# Patient Record
Sex: Male | Born: 1995 | Race: White | Hispanic: No | Smoking: Current every day smoker
Health system: Southern US, Community
[De-identification: ages and names within clinical notes are randomized; demographics above are authoritative.]

## PROBLEM LIST (undated history)

## (undated) DIAGNOSIS — F319 Bipolar disorder, unspecified: Secondary | ICD-10-CM

## (undated) HISTORY — PX: NOSE SURGERY: SHX723

---
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
## 2013-04-21 ENCOUNTER — Encounter (HOSPITAL_COMMUNITY): Payer: Self-pay | Admitting: Emergency Medicine

## 2013-04-21 DIAGNOSIS — R1110 Vomiting, unspecified: Secondary | ICD-10-CM | POA: Insufficient documentation

## 2013-04-21 DIAGNOSIS — R0602 Shortness of breath: Secondary | ICD-10-CM | POA: Insufficient documentation

## 2013-04-21 DIAGNOSIS — K219 Gastro-esophageal reflux disease without esophagitis: Secondary | ICD-10-CM | POA: Insufficient documentation

## 2013-04-21 DIAGNOSIS — Z792 Long term (current) use of antibiotics: Secondary | ICD-10-CM | POA: Insufficient documentation

## 2013-04-21 DIAGNOSIS — J45901 Unspecified asthma with (acute) exacerbation: Secondary | ICD-10-CM | POA: Insufficient documentation

## 2013-04-21 DIAGNOSIS — Z8659 Personal history of other mental and behavioral disorders: Secondary | ICD-10-CM | POA: Insufficient documentation

## 2013-04-21 DIAGNOSIS — R0789 Other chest pain: Secondary | ICD-10-CM | POA: Insufficient documentation

## 2013-04-21 DIAGNOSIS — F17200 Nicotine dependence, unspecified, uncomplicated: Secondary | ICD-10-CM | POA: Insufficient documentation

## 2013-04-21 NOTE — ED Notes (Signed)
Pt reports states sharp chest pain, coughing, & vomiting that started about 30 min ago.

## 2013-04-22 ENCOUNTER — Emergency Department (HOSPITAL_COMMUNITY): Payer: Medicaid Other

## 2013-04-22 ENCOUNTER — Emergency Department (HOSPITAL_COMMUNITY)
Admission: EM | Admit: 2013-04-22 | Discharge: 2013-04-22 | Disposition: A | Discharge status: Home / Self Care | Payer: Medicaid Other | Attending: Emergency Medicine | Admitting: Emergency Medicine

## 2013-04-22 DIAGNOSIS — R079 Chest pain, unspecified: Secondary | ICD-10-CM

## 2013-04-22 DIAGNOSIS — K219 Gastro-esophageal reflux disease without esophagitis: Secondary | ICD-10-CM

## 2013-04-22 LAB — CBC WITH DIFFERENTIAL/PLATELET
BASOS PCT: 0 % (ref 0–1)
Basophils Absolute: 0 10*3/uL (ref 0.0–0.1)
EOS ABS: 0.2 10*3/uL (ref 0.0–0.7)
Eosinophils Relative: 2 % (ref 0–5)
HEMATOCRIT: 43.4 % (ref 39.0–52.0)
HEMOGLOBIN: 15.6 g/dL (ref 13.0–17.0)
LYMPHS ABS: 2.3 10*3/uL (ref 0.7–4.0)
Lymphocytes Relative: 28 % (ref 12–46)
MCH: 30.2 pg (ref 26.0–34.0)
MCHC: 35.9 g/dL (ref 30.0–36.0)
MCV: 83.9 fL (ref 78.0–100.0)
MONO ABS: 0.7 10*3/uL (ref 0.1–1.0)
MONOS PCT: 9 % (ref 3–12)
NEUTROS ABS: 4.8 10*3/uL (ref 1.7–7.7)
Neutrophils Relative %: 61 % (ref 43–77)
Platelets: 147 10*3/uL — ABNORMAL LOW (ref 150–400)
RBC: 5.17 MIL/uL (ref 4.22–5.81)
RDW: 12.9 % (ref 11.5–15.5)
WBC: 7.9 10*3/uL (ref 4.0–10.5)

## 2013-04-22 LAB — COMPREHENSIVE METABOLIC PANEL
ALBUMIN: 4.1 g/dL (ref 3.5–5.2)
ALK PHOS: 89 U/L (ref 39–117)
ALT: 32 U/L (ref 0–53)
AST: 26 U/L (ref 0–37)
BUN: 18 mg/dL (ref 6–23)
CHLORIDE: 98 meq/L (ref 96–112)
CO2: 24 mEq/L (ref 19–32)
CREATININE: 0.85 mg/dL (ref 0.50–1.35)
Calcium: 9.1 mg/dL (ref 8.4–10.5)
GLUCOSE: 97 mg/dL (ref 70–99)
POTASSIUM: 3.6 meq/L — AB (ref 3.7–5.3)
Sodium: 138 mEq/L (ref 137–147)
Total Protein: 7.5 g/dL (ref 6.0–8.3)

## 2013-04-22 LAB — TROPONIN I

## 2013-04-22 LAB — LIPASE, BLOOD: LIPASE: 25 U/L (ref 11–59)

## 2013-04-22 MED ORDER — GI COCKTAIL ~~LOC~~
30.0000 mL | Freq: Once | ORAL | Status: AC
  Administered 2013-04-22: 30 mL via ORAL
  Filled 2013-04-22: qty 30

## 2013-04-22 MED ORDER — PANTOPRAZOLE SODIUM 40 MG IV SOLR
40.0000 mg | Freq: Once | INTRAVENOUS | Status: AC
  Administered 2013-04-22: 40 mg via INTRAVENOUS
  Filled 2013-04-22: qty 40

## 2013-04-22 MED ORDER — SODIUM CHLORIDE 0.9 % IV SOLN
1000.0000 mL | Freq: Once | INTRAVENOUS | Status: AC
  Administered 2013-04-22: 1000 mL via INTRAVENOUS

## 2013-04-22 MED ORDER — ONDANSETRON HCL 4 MG/2ML IJ SOLN
4.0000 mg | Freq: Once | INTRAMUSCULAR | Status: AC
  Administered 2013-04-22: 4 mg via INTRAVENOUS
  Filled 2013-04-22: qty 2

## 2013-04-22 MED ORDER — SODIUM CHLORIDE 0.9 % IV SOLN: 1000.0000 mL | INTRAVENOUS | Status: DC

## 2013-04-22 NOTE — Discharge Instructions (Signed)
Take Omeprazole (Prilosec OTC) once a day.  Stop using all tobacco products - they can cause heart problems, lung problems, and stomach problems.  Gastroesophageal Reflux Disease, Adult Gastroesophageal reflux disease (GERD) happens when acid from your stomach flows up into the esophagus. When acid comes in contact with the esophagus, the acid causes soreness (inflammation) in the esophagus. Over time, GERD may create small holes (ulcers) in the lining of the esophagus. CAUSES   Increased body weight. This puts pressure on the stomach, making acid rise from the stomach into the esophagus.  Smoking. This increases acid production in the stomach.  Drinking alcohol. This causes decreased pressure in the lower esophageal sphincter (valve or ring of muscle between the esophagus and stomach), allowing acid from the stomach into the esophagus.  Late evening meals and a full stomach. This increases pressure and acid production in the stomach.  A malformed lower esophageal sphincter. Sometimes, no cause is found. SYMPTOMS   Burning pain in the lower part of the mid-chest behind the breastbone and in the mid-stomach area. This may occur twice a week or more often.  Trouble swallowing.  Sore throat.  Dry cough.  Asthma-like symptoms including chest tightness, shortness of breath, or wheezing. DIAGNOSIS  Your caregiver may be able to diagnose GERD based on your symptoms. In some cases, X-rays and other tests may be done to check for complications or to check the condition of your stomach and esophagus. TREATMENT  Your caregiver may recommend over-the-counter or prescription medicines to help decrease acid production. Ask your caregiver before starting or adding any new medicines.  HOME CARE INSTRUCTIONS   Change the factors that you can control. Ask your caregiver for guidance concerning weight loss, quitting smoking, and alcohol consumption.  Avoid foods and drinks that make your symptoms  worse, such as:  Caffeine or alcoholic drinks.  Chocolate.  Peppermint or mint flavorings.  Garlic and onions.  Spicy foods.  Citrus fruits, such as oranges, lemons, or limes.  Tomato-based foods such as sauce, chili, salsa, and pizza.  Fried and fatty foods.  Avoid lying down for the 3 hours prior to your bedtime or prior to taking a nap.  Eat small, frequent meals instead of large meals.  Wear loose-fitting clothing. Do not wear anything tight around your waist that causes pressure on your stomach.  Raise the head of your bed 6 to 8 inches with wood blocks to help you sleep. Extra pillows will not help.  Only take over-the-counter or prescription medicines for pain, discomfort, or fever as directed by your caregiver.  Do not take aspirin, ibuprofen, or other nonsteroidal anti-inflammatory drugs (NSAIDs). SEEK IMMEDIATE MEDICAL CARE IF:   You have pain in your arms, neck, jaw, teeth, or back.  Your pain increases or changes in intensity or duration.  You develop nausea, vomiting, or sweating (diaphoresis).  You develop shortness of breath, or you faint.  Your vomit is green, yellow, black, or looks like coffee grounds or blood.  Your stool is red, bloody, or black. These symptoms could be signs of other problems, such as heart disease, gastric bleeding, or esophageal bleeding. MAKE SURE YOU:   Understand these instructions.  Will watch your condition.  Will get help right away if you are not doing well or get worse. Document Released: 12/19/2004 Document Revised: 06/03/2011 Document Reviewed: 09/28/2010 [facility] Patient Information 2014 [place], [place].  Chest Pain (Nonspecific) It is often hard to give a specific diagnosis for the cause of chest pain. There  is always a chance that your pain could be related to something serious, such as a heart attack or a blood clot in the lungs. You need to follow up with your caregiver for further evaluation. CAUSES    Heartburn.  Pneumonia or bronchitis.  Anxiety or stress.  Inflammation around your heart (pericarditis) or lung (pleuritis or pleurisy).  A blood clot in the lung.  A collapsed lung (pneumothorax). It can develop suddenly on its own (spontaneous pneumothorax) or from injury (trauma) to the chest.  Shingles infection (herpes zoster virus). The chest wall is composed of bones, muscles, and cartilage. Any of these can be the source of the pain.  The bones can be bruised by injury.  The muscles or cartilage can be strained by coughing or overwork.  The cartilage can be affected by inflammation and become sore (costochondritis). DIAGNOSIS  Lab tests or other studies, such as X-rays, electrocardiography, stress testing, or cardiac imaging, may be needed to find the cause of your pain.  TREATMENT   Treatment depends on what may be causing your chest pain. Treatment may include:  Acid blockers for heartburn.  Anti-inflammatory medicine.  Pain medicine for inflammatory conditions.  Antibiotics if an infection is present.  You may be advised to change lifestyle habits. This includes stopping smoking and avoiding alcohol, caffeine, and chocolate.  You may be advised to keep your head raised (elevated) when sleeping. This reduces the chance of acid going backward from your stomach into your esophagus.  Most of the time, nonspecific chest pain will improve within 2 to 3 days with rest and mild pain medicine. HOME CARE INSTRUCTIONS   If antibiotics were prescribed, take your antibiotics as directed. Finish them even if you start to feel better.  For the next few days, avoid physical activities that bring on chest pain. Continue physical activities as directed.  Do not smoke.  Avoid drinking alcohol.  Only take over-the-counter or prescription medicine for pain, discomfort, or fever as directed by your caregiver.  Follow your caregiver's suggestions for further testing if your  chest pain does not go away.  Keep any follow-up appointments you made. If you do not go to an appointment, you could develop lasting (chronic) problems with pain. If there is any problem keeping an appointment, you must call to reschedule. SEEK MEDICAL CARE IF:   You think you are having problems from the medicine you are taking. Read your medicine instructions carefully.  Your chest pain does not go away, even after treatment.  You develop a rash with blisters on your chest. SEEK IMMEDIATE MEDICAL CARE IF:   You have increased chest pain or pain that spreads to your arm, neck, jaw, back, or abdomen.  You develop shortness of breath, an increasing cough, or you are coughing up blood.  You have severe back or abdominal pain, feel nauseous, or vomit.  You develop severe weakness, fainting, or chills.  You have a fever. THIS IS AN EMERGENCY. Do not wait to see if the pain will go away. Get medical help at once. Call your local emergency services (911 in [place] Do not drive yourself to the hospital. MAKE SURE YOU:   Understand these instructions.  Will watch your condition.  Will get help right away if you are not doing well or get worse. Document Released: 12/19/2004 Document Revised: 06/03/2011 Document Reviewed: 10/15/2007 [facility] Patient Information 2014 [place], [place].  Smoking Hazards Smoking cigarettes is extremely bad for your health. Tobacco smoke has over 200 known  poisons in it. It contains the poisonous gases nitrogen oxide and carbon monoxide. There are over 60 chemicals in tobacco smoke that cause cancer. Some of the chemicals found in cigarette smoke include:   Cyanide.   Benzene.   Formaldehyde.   Methanol (wood alcohol).   Acetylene (fuel used in welding torches).   Ammonia.  Even smoking lightly shortens your life expectancy by several years. You can greatly reduce the risk of medical problems for you and your family by stopping now. Smoking is the  most preventable cause of death and disease in our society. Within days of quitting smoking, your circulation improves, you decrease the risk of having a heart attack, and your lung capacity improves. There may be some increased phlegm in the first few days after quitting, and it may take months for your lungs to clear up completely. Quitting for 10 years reduces your risk of developing lung cancer to almost that of a nonsmoker.  WHAT ARE THE RISKS OF SMOKING? Cigarette smokers have an increased risk of many serious medical problems, including:  Lung cancer.   Lung disease (such as pneumonia, bronchitis, and emphysema).   Heart attack and chest pain due to the heart not getting enough oxygen (angina).   Heart disease and peripheral blood vessel disease.   Hypertension.   Stroke.   Oral cancer (cancer of the lip, mouth, or voice box).   Bladder cancer.   Pancreatic cancer.   Cervical cancer.   Pregnancy complications, including premature birth.   Stillbirths and smaller newborn babies, birth defects, and genetic damage to sperm.   Early menopause.   Lower estrogen level for women.   Infertility.   Facial wrinkles.   Blindness.   Increased risk of broken bones (fractures).   Senile dementia.   Stomach ulcers and internal bleeding.   Delayed wound healing and increased risk of complications during surgery. Because of secondhand smoke exposure, children of smokers have an increased risk of the following:   Sudden infant death syndrome (SIDS).   Respiratory infections.   Lung cancer.   Heart disease.   Ear infections.  WHY IS SMOKING ADDICTIVE? Nicotine is the chemical agent in tobacco that is capable of causing addiction or dependence. When you smoke and inhale, nicotine is absorbed rapidly into the bloodstream through your lungs. Both inhaled and noninhaled nicotine may be addictive.  WHAT ARE THE BENEFITS OF QUITTING?  There are many  health benefits to quitting smoking. Some are:   The likelihood of developing cancer and heart disease decreases. Health improvements are seen almost immediately.   Blood pressure, pulse rate, and breathing patterns start returning to normal soon after quitting.   People who quit may see an improvement in their overall quality of life.  HOW DO YOU QUIT SMOKING? Smoking is an addiction with both physical and psychological effects, and longtime habits can be hard to change. Your health care provider can recommend:  Programs and community resources, which may include group support, education, or therapy.  Replacement products, such as patches, gum, and nasal sprays. Use these products only as directed. Do not replace cigarette smoking with electronic cigarettes (commonly called e-cigarettes). The safety of e-cigarettes is unknown, and some may contain harmful chemicals. FOR MORE INFORMATION  American Lung Association: www.lung.org  American Cancer Society: www.cancer.org Document Released: 04/18/2004 Document Revised: 12/30/2012 Document Reviewed: 08/31/2012 [facility] Patient Information 2014 [place].

## 2013-04-22 NOTE — ED Provider Notes (Signed)
CSN: [id number]     Arrival date & time 04/21/13  2335 History   First MD Initiated Contact with Patient 04/22/13 [phone]     Chief Complaint  Patient presents with  . Chest Pain  . Shortness of Breath  . Emesis   (Consider location/radiation/quality/duration/timing/severity/associated sxs/prior Treatment) Patient is a 18 y.o. male presenting with chest pain, shortness of breath, and vomiting. The history is provided by the patient.  Chest Pain Associated symptoms: shortness of breath and vomiting   Shortness of Breath Associated symptoms: chest pain and vomiting   Emesis He had onset about 10 PM of severe anterior chest pain, dyspnea, nausea, vomiting. This came on when he lay down to go to sleep. He had similar symptoms last night but he was given diphenhydramine and went to sleep and symptoms went away until tonight. His pain does seem to be worse when he lays down. He describes the pain as sharp and rates it at 10/10. He denies any blood in his emesis. He is extremely anxious about the symptoms. The there was no diaphoresis and no fever or chills. He is a cigarette smoker but denies ethanol use.   Past Medical History  Diagnosis Date  . Asthma   . Bipolar disorder    Past Surgical History  Procedure Laterality Date  . Nose surgery     Family History  Problem Relation Age of Onset  . Asthma Other    History  Substance Use Topics  . Smoking status: Current Every Day Smoker -- 0.50 packs/day for 1.5 years    Types: Cigarettes  . Smokeless tobacco: Current User    Types: Snuff  . Alcohol Use: No    Review of Systems  Respiratory: Positive for shortness of breath.   Cardiovascular: Positive for chest pain.  Gastrointestinal: Positive for vomiting.  All other systems reviewed and are negative.    Allergies  Review of patient's allergies indicates no known allergies.  Home Medications   Current Outpatient Rx  Name  Route  Sig  Dispense  Refill  . ibuprofen  (ADVIL,MOTRIN) 200 MG tablet   Oral   Take 800 mg by mouth every 6 (six) hours as needed (pt takes ibuprofen pm for sleep).         Marland Kitchen ibuprofen (ADVIL,MOTRIN) 600 MG tablet   Oral   Take 1 tablet (600 mg total) by mouth every 6 (six) hours as needed for pain.   30 tablet   0   . albuterol (PROVENTIL HFA;VENTOLIN HFA) 108 (90 BASE) MCG/ACT inhaler   Inhalation   Inhale 2 puffs into the lungs every 6 (six) hours as needed. For shortness of breath         . amoxicillin (AMOXIL) 500 MG capsule   Oral   Take 1 capsule (500 mg total) by mouth 3 (three) times daily.   21 capsule   0   . HYDROcodone-acetaminophen (NORCO/VICODIN) 5-325 MG per tablet   Oral   Take 1 tablet by mouth every 4 (four) hours as needed.   15 tablet   0   . mupirocin cream (BACTROBAN) 2 %   Topical   Apply topically 3 (three) times daily.   15 g   0    BP 141/81  Pulse 86  Temp(Src) 97.6 F (36.4 C) (Oral)  Resp 24  Ht 6' (1.829 m)  Wt 250 lb (113.399 kg)  BMI 33.90 kg/m2  SpO2 100% Physical Exam  Nursing note and vitals reviewed.  17 year  old male, resting comfortably and in no acute distress. Vital signs are  significant for borderline hypertension with blood pressure 141/81, and tachypnea with respiratory rate of 24 . Oxygen saturation is 100%, which is normal. Head is normocephalic and atraumatic. PERRLA, EOMI. Oropharynx is clear. Neck is nontender and supple without adenopathy or JVD. Back is nontender and there is no CVA tenderness. Lungs are clear without rales, wheezes, or rhonchi. Chest is nontender. Heart has regular rate and rhythm without murmur. Abdomen is soft, flat,  with mild epigastric tenderness. There is no right upper quadrant tenderness. There are no  masses or hepatosplenomegaly and peristalsis is hypoactive. Extremities have no cyanosis or edema, full range of motion is present. Skin is warm and dry without rash. Neurologic: Mental status is normal, cranial nerves are  intact, there are no motor or sensory deficits.  ED Course  Procedures (including critical care time) Labs Review Results for orders placed during the hospital encounter of 04/22/13  CBC WITH DIFFERENTIAL      Result Value Range   WBC 7.9  4.0 - 10.5 K/uL   RBC 5.17  4.22 - 5.81 MIL/uL   Hemoglobin 15.6  13.0 - 17.0 g/dL   HCT 16.1  09.6 - 04.5 %   MCV 83.9  78.0 - 100.0 fL   MCH 30.2  26.0 - 34.0 pg   MCHC 35.9  30.0 - 36.0 g/dL   RDW 40.9  81.1 - 91.4 %   Platelets 147 (*) 150 - 400 K/uL   Neutrophils Relative % 61  43 - 77 %   Neutro Abs 4.8  1.7 - 7.7 K/uL   Lymphocytes Relative 28  12 - 46 %   Lymphs Abs 2.3  0.7 - 4.0 K/uL   Monocytes Relative 9  3 - 12 %   Monocytes Absolute 0.7  0.1 - 1.0 K/uL   Eosinophils Relative 2  0 - 5 %   Eosinophils Absolute 0.2  0.0 - 0.7 K/uL   Basophils Relative 0  0 - 1 %   Basophils Absolute 0.0  0.0 - 0.1 K/uL  COMPREHENSIVE METABOLIC PANEL      Result Value Range   Sodium 138  137 - 147 mEq/L   Potassium 3.6 (*) 3.7 - 5.3 mEq/L   Chloride 98  96 - 112 mEq/L   CO2 24  19 - 32 mEq/L   Glucose, Bld 97  70 - 99 mg/dL   BUN 18  6 - 23 mg/dL   Creatinine, Ser 7.82  0.50 - 1.35 mg/dL   Calcium 9.1  8.4 - 95.6 mg/dL   Total Protein 7.5  6.0 - 8.3 g/dL   Albumin 4.1  3.5 - 5.2 g/dL   AST 26  0 - 37 U/L   ALT 32  0 - 53 U/L   Alkaline Phosphatase 89  39 - 117 U/L   Total Bilirubin <0.2 (*) 0.3 - 1.2 mg/dL   GFR calc non Af Amer >90  >90 mL/min   GFR calc Af Amer >90  >90 mL/min  LIPASE, BLOOD      Result Value Range   Lipase 25  11 - 59 U/L  TROPONIN I      Result Value Range   Troponin I <0.30  <0.30 ng/mL   Imaging Review Dg Chest 2 View  04/22/2013   CLINICAL DATA:  Chest pain, shortness of breath  EXAM: CHEST  2 VIEW  COMPARISON:  02/13/2010  FINDINGS: The heart size and  mediastinal contours are within normal limits. Both lungs are clear. The visualized skeletal structures are unremarkable.  IMPRESSION: No active cardiopulmonary  disease.   Electronically Signed   By: Ruel Favorsrevor  Shick M.D.   On: 04/22/2013 01:41    EKG Interpretation    Date/Time:  Wednesday April 21 2013 23:52:16 EST Ventricular Rate:  79 PR Interval:  172 QRS Duration: 98 QT Interval:  356 QTC Calculation: 408 R Axis:   35 Text Interpretation:  Normal sinus rhythm Normal ECG When compared with ECG of 18-Dec-2009 17:26, PREVIOUS ECG IS PRESENT Sinus rhythm Normal ECG Confirmed by Gerhard MunchLOCKWOOD, ROBERT  MD ([id number]) on 04/21/2013 11:57:40 PM            MDM   1. Chest pain   2. GERD (gastroesophageal reflux disease)    Chest pain which is worse when supine which is strongly suggestive of GERD. ECG is normal. He'll be given a trial of a GI cocktail and pantoprazole and screening labs obtained. Old records are reviewed and he has no relevant past visits.   He feels significantly better after above noted treatment. Laboratory workup is unremarkable. He is discharged with instructions to stop using tobacco products and to take over-the-counter omeprazole he has requested an inhaler, but there is no evidence of wheezing on exam and no indication for giving an inhaler today.  Dione Boozeavid Marti Acebo, MD 04/22/13 [id number]

## 2013-04-22 NOTE — ED Notes (Signed)
Pt reporting pain in middle of chest. Stated pain began about an hour ago.  Reporting pain worse with cough.

## 2014-01-03 ENCOUNTER — Emergency Department (HOSPITAL_COMMUNITY)
Admission: EM | Admit: 2014-01-03 | Discharge: 2014-01-04 | Disposition: A | Discharge status: Home / Self Care | Payer: Medicaid Other | Attending: Emergency Medicine | Admitting: Emergency Medicine

## 2014-01-03 ENCOUNTER — Encounter (HOSPITAL_COMMUNITY): Payer: Self-pay | Admitting: Emergency Medicine

## 2014-01-03 DIAGNOSIS — Z8659 Personal history of other mental and behavioral disorders: Secondary | ICD-10-CM | POA: Insufficient documentation

## 2014-01-03 DIAGNOSIS — J45909 Unspecified asthma, uncomplicated: Secondary | ICD-10-CM | POA: Insufficient documentation

## 2014-01-03 DIAGNOSIS — L0501 Pilonidal cyst with abscess: Secondary | ICD-10-CM | POA: Diagnosis not present

## 2014-01-03 DIAGNOSIS — Z79899 Other long term (current) drug therapy: Secondary | ICD-10-CM | POA: Diagnosis not present

## 2014-01-03 DIAGNOSIS — K6289 Other specified diseases of anus and rectum: Secondary | ICD-10-CM | POA: Diagnosis present

## 2014-01-03 DIAGNOSIS — Z791 Long term (current) use of non-steroidal anti-inflammatories (NSAID): Secondary | ICD-10-CM | POA: Diagnosis not present

## 2014-01-03 DIAGNOSIS — Z720 Tobacco use: Secondary | ICD-10-CM | POA: Diagnosis not present

## 2014-01-03 DIAGNOSIS — Z792 Long term (current) use of antibiotics: Secondary | ICD-10-CM | POA: Diagnosis not present

## 2014-01-03 NOTE — ED Notes (Signed)
Patient complaining of rectal pain for approximately a week.

## 2014-01-04 MED ORDER — HYDROCODONE-ACETAMINOPHEN 5-325 MG PO TABS: 1.0000 | ORAL_TABLET | Freq: Four times a day (QID) | ORAL | Status: DC | PRN

## 2014-01-04 MED ORDER — LIDOCAINE-EPINEPHRINE (PF) 1 %-1:200000 IJ SOLN
INTRAMUSCULAR | Status: AC
  Administered 2014-01-04: 10 mL
  Filled 2014-01-04: qty 10

## 2014-01-04 MED ORDER — HYDROMORPHONE HCL 2 MG/ML IJ SOLN
INTRAMUSCULAR | Status: AC
  Administered 2014-01-04: 2 mg
  Filled 2014-01-04: qty 1

## 2014-01-04 NOTE — ED Provider Notes (Signed)
CSN: [id number]     Arrival date & time 01/03/14  2316 History   First MD Initiated Contact with Patient 01/04/14 0146     Chief Complaint  Patient presents with  . Rectal Pain     (Consider location/radiation/quality/duration/timing/severity/associated sxs/prior Treatment) HPI This is a 18 year old male with a one-week history of pain just to the left of his coccyx. The pain has worsened gradually throughout the week and is now severe. It is worse with movement or ambulation. There is a palpable mass at the site. He denies any injury. He denies systemic symptoms such as fever or chills. He denies abdominal pain. He denies difficulty or pain with defecation.  Past Medical History  Diagnosis Date  . Asthma   . Bipolar disorder    Past Surgical History  Procedure Laterality Date  . Nose surgery     Family History  Problem Relation Age of Onset  . Asthma Other    History  Substance Use Topics  . Smoking status: Current Every Day Smoker -- 0.50 packs/day for 1.5 years    Types: Cigarettes  . Smokeless tobacco: Current User    Types: Snuff  . Alcohol Use: No    Review of Systems  All other systems reviewed and are negative.   Allergies  Review of patient's allergies indicates no known allergies.  Home Medications   Prior to Admission medications   Medication Sig Start Date End Date Taking? Authorizing Provider  albuterol (PROVENTIL HFA;VENTOLIN HFA) 108 (90 BASE) MCG/ACT inhaler Inhale 2 puffs into the lungs every 6 (six) hours as needed. For shortness of breath    Historical Provider, MD  amoxicillin (AMOXIL) 500 MG capsule Take 1 capsule (500 mg total) by mouth 3 (three) times daily. 08/17/12   Kathie DikeHobson M Bryant, PA-C  HYDROcodone-acetaminophen (NORCO/VICODIN) 5-325 MG per tablet Take 1 tablet by mouth every 4 (four) hours as needed. 10/11/12   Hope Orlene OchM Neese, NP  ibuprofen (ADVIL,MOTRIN) 200 MG tablet Take 800 mg by mouth every 6 (six) hours as needed (pt takes ibuprofen pm  for sleep).    Historical Provider, MD  ibuprofen (ADVIL,MOTRIN) 600 MG tablet Take 1 tablet (600 mg total) by mouth every 6 (six) hours as needed for pain. 10/11/12   Hope Orlene OchM Neese, NP  mupirocin cream (BACTROBAN) 2 % Apply topically 3 (three) times daily. 08/17/12   Kathie DikeHobson M Bryant, PA-C   BP 136/75  Pulse 99  Temp(Src) 98.5 F (36.9 C) (Oral)  Resp 24  Ht 6' (1.829 m)  Wt 262 lb 3.2 oz (118.933 kg)  BMI 35.55 kg/m2  SpO2 98%  Physical Exam General: Well-developed, well-nourished male in no acute distress; appearance consistent with age of record HENT: normocephalic; atraumatic Eyes: Normal appearance Neck: supple Heart: regular rate and rhythm Lungs: Normal respiratory effort and excursion Abdomen: soft; nondistended; nontender; bowel sounds present Extremities: No deformity; full range of motion Neurologic: Awake, alert and oriented; motor function intact in all extremities and symmetric; no facial droop Skin: Warm and dry; draining abscess just to the left and inferior to the coccyx Psychiatric: Normal mood and affect    ED Course  Procedures (including critical care time)  INCISION AND DRAINAGE Performed by: Hanley SeamenMOLPUS,Lashala Laser L Consent: Verbal consent obtained. Risks and benefits: risks, benefits and alternatives were discussed Type: abscess  Body area: Left buttock  Anesthesia: local infiltration  Incision was made with a scalpel to enlarge the existing skin defect  Local anesthetic: lidocaine 2 % with epinephrine  Anesthetic total:  2 ml  Complexity: complex Blunt dissection to break up loculations  Drainage: purulent  Drainage amount: Small   Packing material: None   Patient tolerance: Patient tolerated the procedure well with no immediate complications.     MDM    Hanley SeamenJohn L Meaghann Choo, MD 01/04/14 [phone]

## 2014-01-17 MED FILL — Hydrocodone-Acetaminophen Tab 5-325 MG: ORAL | Qty: 6 | Status: AC

## 2014-03-02 ENCOUNTER — Emergency Department (HOSPITAL_COMMUNITY)
Admission: EM | Admit: 2014-03-02 | Discharge: 2014-03-02 | Disposition: A | Discharge status: Home / Self Care | Payer: Medicaid Other | Attending: Emergency Medicine | Admitting: Emergency Medicine

## 2014-03-02 ENCOUNTER — Encounter (HOSPITAL_COMMUNITY): Payer: Self-pay | Admitting: Emergency Medicine

## 2014-03-02 ENCOUNTER — Emergency Department (HOSPITAL_COMMUNITY): Payer: Medicaid Other

## 2014-03-02 DIAGNOSIS — Y92310 Basketball court as the place of occurrence of the external cause: Secondary | ICD-10-CM | POA: Insufficient documentation

## 2014-03-02 DIAGNOSIS — Y998 Other external cause status: Secondary | ICD-10-CM | POA: Diagnosis not present

## 2014-03-02 DIAGNOSIS — Z79899 Other long term (current) drug therapy: Secondary | ICD-10-CM | POA: Insufficient documentation

## 2014-03-02 DIAGNOSIS — S59912A Unspecified injury of left forearm, initial encounter: Secondary | ICD-10-CM | POA: Diagnosis present

## 2014-03-02 DIAGNOSIS — W01198A Fall on same level from slipping, tripping and stumbling with subsequent striking against other object, initial encounter: Secondary | ICD-10-CM | POA: Diagnosis not present

## 2014-03-02 DIAGNOSIS — J45909 Unspecified asthma, uncomplicated: Secondary | ICD-10-CM | POA: Insufficient documentation

## 2014-03-02 DIAGNOSIS — S5012XA Contusion of left forearm, initial encounter: Secondary | ICD-10-CM | POA: Insufficient documentation

## 2014-03-02 DIAGNOSIS — Z87891 Personal history of nicotine dependence: Secondary | ICD-10-CM | POA: Diagnosis not present

## 2014-03-02 DIAGNOSIS — Y9367 Activity, basketball: Secondary | ICD-10-CM | POA: Diagnosis not present

## 2014-03-02 DIAGNOSIS — Z8659 Personal history of other mental and behavioral disorders: Secondary | ICD-10-CM | POA: Insufficient documentation

## 2014-03-02 DIAGNOSIS — W19XXXA Unspecified fall, initial encounter: Secondary | ICD-10-CM

## 2014-03-02 MED ORDER — HYDROCODONE-ACETAMINOPHEN 5-325 MG PO TABS: 1.0000 | ORAL_TABLET | ORAL | Status: DC | PRN

## 2014-03-02 MED ORDER — IBUPROFEN 600 MG PO TABS: 600.0000 mg | ORAL_TABLET | Freq: Four times a day (QID) | ORAL | Status: DC | PRN

## 2014-03-02 MED ORDER — OXYCODONE-ACETAMINOPHEN 5-325 MG PO TABS
1.0000 | ORAL_TABLET | Freq: Once | ORAL | Status: AC
  Administered 2014-03-02: 1 via ORAL
  Filled 2014-03-02: qty 1

## 2014-03-02 NOTE — ED Provider Notes (Signed)
Patient presented to the ER for evaluation of left forearm pain. Patient reports that he felt proximally 2 hours ago while playing basketball. Patient complaining of pain in the forearm that goes all the way down to the fingertips.   Face to face Exam: HEENT - PERRLA Lungs - CTAB Heart - RRR, no M/R/G Abd - S/NT/ND Neuro - alert, oriented x3 Musculoskeletal - superficial abrasion over the ulnar aspect of forearm with tenderness, no crepitance. Compartments are soft, minimal swelling. Radial pulses palpable.  Plan: X-ray negative. Patient to be treated with analgesia, rest. Patient counseled on return precautions including signs of compartment syndrome.    Gilda Creasehristopher J. Darrel Gloss, MD 03/02/14 2240

## 2014-03-02 NOTE — Discharge Instructions (Signed)
Contusion A contusion is a deep bruise. Contusions are the result of an injury that caused bleeding under the skin. The contusion may turn blue, purple, or yellow. Minor injuries will give you a painless contusion, but more severe contusions may stay painful and swollen for a few weeks.  CAUSES  A contusion is usually caused by a blow, trauma, or direct force to an area of the body. SYMPTOMS   Swelling and redness of the injured area.  Bruising of the injured area.  Tenderness and soreness of the injured area.  Pain. DIAGNOSIS  The diagnosis can be made by taking a history and physical exam. An X-ray, CT scan, or MRI may be needed to determine if there were any associated injuries, such as fractures. TREATMENT  Specific treatment will depend on what area of the body was injured. In general, the best treatment for a contusion is resting, icing, elevating, and applying cold compresses to the injured area. Over-the-counter medicines may also be recommended for pain control. Ask your caregiver what the best treatment is for your contusion. HOME CARE INSTRUCTIONS   Put ice on the injured area.  Put ice in a plastic bag.  Place a towel between your skin and the bag.  Leave the ice on for 15-20 minutes, 3-4 times a day, or as directed by your health care provider.  Only take over-the-counter or prescription medicines for pain, discomfort, or fever as directed by your caregiver. Your caregiver may recommend avoiding anti-inflammatory medicines (aspirin, ibuprofen, and naproxen) for 48 hours because these medicines may increase bruising.  Rest the injured area.  If possible, elevate the injured area to reduce swelling. SEEK IMMEDIATE MEDICAL CARE IF:   You have increased bruising or swelling.  You have pain that is getting worse.  Your swelling or pain is not relieved with medicines. MAKE SURE YOU:   Understand these instructions.  Will watch your condition.  Will get help right  away if you are not doing well or get worse. Document Released: 12/19/2004 Document Revised: 03/16/2013 Document Reviewed: 01/14/2011 [facility] Patient Information 2015 [place], [place]. This information is not intended to replace advice given to you by your health care provider. Make sure you discuss any questions you have with your health care provider.   Your xrays are negative tonight for broken bones or dislocated bones.  I suspect you have deep bruising causing your pain.  Use ice and elevation,  Wear the ace wrap to help with swelling and pain.  Return here or see your doctor if you develop worsened pain, swelling or you develop color changes in your finger tips (blue, white or pale).    You may take the hydrocodone prescribed for pain relief.  This will make you drowsy - do not drive within 4 hours of taking this medication.

## 2014-03-02 NOTE — ED Notes (Signed)
Pt c/o left forearm pain after falling on it while playing basketball.

## 2014-03-02 NOTE — ED Provider Notes (Signed)
CSN: [id number]     Arrival date & time 03/02/14  2141 History   First MD Initiated Contact with Patient 03/02/14 2204     Chief Complaint  Patient presents with  . Arm Pain     (Consider location/radiation/quality/duration/timing/severity/associated sxs/prior Treatment) Patient is a 18 y.o. male presenting with arm pain. The history is provided by the patient.  Arm Pain This is a new problem. Episode onset: 2 hours ago. The problem occurs constantly. The problem has been gradually worsening. Associated symptoms include arthralgias and numbness. Pertinent negatives include no fever, joint swelling, myalgias or weakness. Associated symptoms comments: He fell against a bleacher bench while playing basketball, striking his left forearm.  He reports severe pain and numbness in his fingertips since the event.. Exacerbated by: movement and palpation. He has tried nothing for the symptoms.    Past Medical History  Diagnosis Date  . Asthma   . Bipolar disorder    Past Surgical History  Procedure Laterality Date  . Nose surgery     Family History  Problem Relation Age of Onset  . Asthma Other    History  Substance Use Topics  . Smoking status: Former Smoker -- 0.50 packs/day for 1.5 years    Types: Cigarettes  . Smokeless tobacco: Current User    Types: Snuff  . Alcohol Use: No    Review of Systems  Constitutional: Negative for fever.  Musculoskeletal: Positive for arthralgias. Negative for myalgias and joint swelling.  Skin: Positive for wound.  Neurological: Positive for numbness. Negative for weakness.      Allergies  Review of patient's allergies indicates no known allergies.  Home Medications   Prior to Admission medications   Medication Sig Start Date End Date Taking? Authorizing Provider  albuterol (PROVENTIL HFA;VENTOLIN HFA) 108 (90 BASE) MCG/ACT inhaler Inhale 2 puffs into the lungs every 6 (six) hours as needed. For shortness of breath    Historical Provider, MD   amoxicillin (AMOXIL) 500 MG capsule Take 1 capsule (500 mg total) by mouth 3 (three) times daily. Patient not taking: Reported on 03/02/2014 08/17/12   Kathie DikeHobson M Bryant, PA-C  HYDROcodone-acetaminophen (NORCO/VICODIN) 5-325 MG per tablet Take 1 tablet by mouth every 4 (four) hours as needed. 03/02/14   Burgess AmorJulie Panayiota Larkin, PA-C  ibuprofen (ADVIL,MOTRIN) 600 MG tablet Take 1 tablet (600 mg total) by mouth every 6 (six) hours as needed. 03/02/14   Burgess AmorJulie Xiomar Crompton, PA-C  mupirocin cream (BACTROBAN) 2 % Apply topically 3 (three) times daily. Patient not taking: Reported on 03/02/2014 08/17/12   Kathie DikeHobson M Bryant, PA-C   BP 135/81 mmHg  Pulse 99  Temp(Src) 98.6 F (37 C) (Oral)  Resp 24  Ht 6' (1.829 m)  Wt 245 lb (111.131 kg)  BMI 33.22 kg/m2  SpO2 100% Physical Exam  Constitutional: He appears well-developed and well-nourished.  HENT:  Head: Normocephalic and atraumatic.  Neck: Normal range of motion.  Cardiovascular:  Pulses equal bilaterally  Musculoskeletal: He exhibits edema and tenderness.       Left forearm: He exhibits bony tenderness and swelling. He exhibits no deformity.  ttp along mid left forearm.  Soft.  Distal sensation in fingertips reduced.  He displays ROM of fingers and digits, but reduced due to radiation of pain into forearm.  Less than 2 second cap refill in fingertips. Abrasion lateral mid forearm.  Elbow and shoulder nontender.  Neurological: He is alert. He has normal strength. He displays normal reflexes. No sensory deficit.  Reflex Scores:  Bicep reflexes are 2+ on the right side and 2+ on the left side. Equal grip strength  Skin: Skin is warm and dry.  Psychiatric: He has a normal mood and affect.    ED Course  Procedures (including critical care time) Labs Review Labs Reviewed - No data to display  Imaging Review Dg Forearm Left  03/02/2014   CLINICAL DATA:  Playing basketball tonight around 8:30 p.m. Injury with pain and numbness to the left forearm. Abrasions.   EXAM: LEFT FOREARM - 2 VIEW  COMPARISON:  None.  FINDINGS: There is no evidence of fracture or other focal bone lesions. Soft tissues are unremarkable.  IMPRESSION: Negative.   Electronically Signed   By: Burman NievesWilliam  Stevens M.D.   On: 03/02/2014 22:09     EKG Interpretation None      MDM   Final diagnoses:  Fall  Forearm contusion, left, initial encounter    Pt was also seen by Dr. Blinda LeatherwoodPollina during this visit.  Pt placed in jones dressing, encouraged ice,elevation, discussed sx to prompt immediate recheck including worse pain, pallor in fingertips, worsened numbness or swelling of forearm.   Forearm is currently soft.  Suspect inflammation as source of distal neuropraxia.  He was given scripts for ibuprofen and hydrocodone.   Patients labs and/or radiological studies were viewed and considered during the medical decision making and disposition process.     Burgess AmorJulie Marvella Jenning, PA-C 03/03/14 1337  Burgess AmorJulie Dynasty Holquin, PA-C 03/03/14 1338  Gilda Creasehristopher J. Pollina, MD 03/03/14 2118

## 2014-03-21 ENCOUNTER — Encounter (HOSPITAL_COMMUNITY): Payer: Self-pay | Admitting: Emergency Medicine

## 2014-03-21 ENCOUNTER — Emergency Department (HOSPITAL_COMMUNITY): Payer: Medicaid Other

## 2014-03-21 ENCOUNTER — Emergency Department (HOSPITAL_COMMUNITY)
Admission: EM | Admit: 2014-03-21 | Discharge: 2014-03-21 | Disposition: A | Discharge status: Home / Self Care | Payer: Medicaid Other | Attending: Emergency Medicine | Admitting: Emergency Medicine

## 2014-03-21 DIAGNOSIS — Y998 Other external cause status: Secondary | ICD-10-CM | POA: Diagnosis not present

## 2014-03-21 DIAGNOSIS — S86811A Strain of other muscle(s) and tendon(s) at lower leg level, right leg, initial encounter: Secondary | ICD-10-CM | POA: Diagnosis not present

## 2014-03-21 DIAGNOSIS — Y9289 Other specified places as the place of occurrence of the external cause: Secondary | ICD-10-CM | POA: Insufficient documentation

## 2014-03-21 DIAGNOSIS — Z79899 Other long term (current) drug therapy: Secondary | ICD-10-CM | POA: Diagnosis not present

## 2014-03-21 DIAGNOSIS — W010XXA Fall on same level from slipping, tripping and stumbling without subsequent striking against object, initial encounter: Secondary | ICD-10-CM | POA: Diagnosis not present

## 2014-03-21 DIAGNOSIS — Z87891 Personal history of nicotine dependence: Secondary | ICD-10-CM | POA: Diagnosis not present

## 2014-03-21 DIAGNOSIS — Z8659 Personal history of other mental and behavioral disorders: Secondary | ICD-10-CM | POA: Insufficient documentation

## 2014-03-21 DIAGNOSIS — J45909 Unspecified asthma, uncomplicated: Secondary | ICD-10-CM | POA: Diagnosis not present

## 2014-03-21 DIAGNOSIS — Y9367 Activity, basketball: Secondary | ICD-10-CM | POA: Insufficient documentation

## 2014-03-21 DIAGNOSIS — S8391XA Sprain of unspecified site of right knee, initial encounter: Secondary | ICD-10-CM | POA: Insufficient documentation

## 2014-03-21 DIAGNOSIS — S8991XA Unspecified injury of right lower leg, initial encounter: Secondary | ICD-10-CM | POA: Diagnosis present

## 2014-03-21 MED ORDER — HYDROCODONE-ACETAMINOPHEN 5-325 MG PO TABS: 1.0000 | ORAL_TABLET | ORAL | Status: DC | PRN

## 2014-03-21 MED ORDER — ONDANSETRON HCL 4 MG PO TABS
4.0000 mg | ORAL_TABLET | Freq: Once | ORAL | Status: AC
  Administered 2014-03-21: 4 mg via ORAL
  Filled 2014-03-21: qty 1

## 2014-03-21 MED ORDER — KETOROLAC TROMETHAMINE 10 MG PO TABS
10.0000 mg | ORAL_TABLET | Freq: Once | ORAL | Status: AC
  Administered 2014-03-21: 10 mg via ORAL
  Filled 2014-03-21: qty 1

## 2014-03-21 MED ORDER — DICLOFENAC SODIUM 75 MG PO TBEC: 75.0000 mg | DELAYED_RELEASE_TABLET | Freq: Two times a day (BID) | ORAL | Status: DC

## 2014-03-21 MED ORDER — HYDROCODONE-ACETAMINOPHEN 5-325 MG PO TABS
2.0000 | ORAL_TABLET | Freq: Once | ORAL | Status: AC
  Administered 2014-03-21: 2 via ORAL
  Filled 2014-03-21: qty 2

## 2014-03-21 NOTE — Discharge Instructions (Signed)
Please keep your knee elevated above your waist is much as possible. Please apply ice. Please use the knee immobilizer until seen by Dr. Magnus IvanBlackman, or the orthopedic specialist of your choice. Please use her crutches when up and about. Use diclofenac 2 times daily with food. May use Norco for pain. This medication may cause drowsiness, please do not drive, drink alcohol, operate machinery, and legal documents, anticipate in activities that require concentration when using this medication. Knee Sprain A knee sprain is a tear in one of the strong, fibrous tissues that connect the bones (ligaments) in your knee. The severity of the sprain depends on how much of the ligament is torn. The tear can be either partial or complete. CAUSES  Often, sprains are a result of a fall or injury. The force of the impact causes the fibers of your ligament to stretch too much. This excess tension causes the fibers of your ligament to tear. SIGNS AND SYMPTOMS  You may have some loss of motion in your knee. Other symptoms include:  Bruising.  Pain in the knee area.  Tenderness of the knee to the touch.  Swelling. DIAGNOSIS  To diagnose a knee sprain, your health care provider will physically examine your knee. Your health care provider may also suggest an X-ray exam of your knee to make sure no bones are broken. TREATMENT  If your ligament is only partially torn, treatment usually involves keeping the knee in a fixed position (immobilization) or bracing your knee for activities that require movement for several weeks. To do this, your health care provider will apply a bandage, cast, or splint to keep your knee from moving and to support your knee during movement until it heals. For a partially torn ligament, the healing process usually takes 4-6 weeks. If your ligament is completely torn, depending on which ligament it is, you may need surgery to reconnect the ligament to the bone or reconstruct it. After surgery, a  cast or splint may be applied and will need to stay on your knee for 4-6 weeks while your ligament heals. HOME CARE INSTRUCTIONS  Keep your injured knee elevated to decrease swelling.  To ease pain and swelling, apply ice to the injured area:  Put ice in a plastic bag.  Place a towel between your skin and the bag.  Leave the ice on for 20 minutes, 2-3 times a day.  Only take medicine for pain as directed by your health care provider.  Do not leave your knee unprotected until pain and stiffness go away (usually 4-6 weeks).  If you have a cast or splint, do not allow it to get wet. If you have been instructed not to remove it, cover it with a plastic bag when you shower or bathe. Do not swim.  Your health care provider may suggest exercises for you to do during your recovery to prevent or limit permanent weakness and stiffness. SEEK IMMEDIATE MEDICAL CARE IF:  Your cast or splint becomes damaged.  Your pain becomes worse.  You have significant pain, swelling, or numbness below the cast or splint. MAKE SURE YOU:  Understand these instructions.  Will watch your condition.  Will get help right away if you are not doing well or get worse. Document Released: 03/11/2005 Document Revised: 12/30/2012 Document Reviewed: 10/21/2012 [facility] Patient Information 2015 [place]Care, [place]LLC. This information is not intended to replace advice given to you by your health care provider. Make sure you discuss any questions you have with your health care  provider.

## 2014-03-21 NOTE — ED Notes (Signed)
Pt reports slipped on water on the basketball court and reports right knee pain ever since. nad noted.

## 2014-03-21 NOTE — ED Provider Notes (Signed)
CSN: [id number]     Arrival date & time 03/21/14  1757 History   First MD Initiated Contact with Patient 03/21/14 2119     Chief Complaint  Patient presents with  . Knee Pain     (Consider location/radiation/quality/duration/timing/severity/associated sxs/prior Treatment) Patient is a 18 y.o. male presenting with knee pain. The history is provided by the patient.  Knee Pain Location:  Knee Injury: yes   Mechanism of injury: fall   Mechanism of injury comment:  Pt slipped on wet surface of basketball court. Fall:    Fall occurred:  Recreating/playing and running   Impact surface:  Armed forces training and education officerAthletic surface   Point of impact:  Knees Knee location:  R knee Pain details:    Quality:  Aching   Severity:  Severe   Onset quality:  Sudden   Timing:  Intermittent   Progression:  Worsening Chronicity:  New Dislocation: no   Prior injury to area:  No Relieved by:  Nothing Worsened by:  Bearing weight Associated symptoms: stiffness and swelling   Associated symptoms: no back pain, no neck pain and no numbness   Risk factors: no frequent fractures     Past Medical History  Diagnosis Date  . Asthma   . Bipolar disorder    Past Surgical History  Procedure Laterality Date  . Nose surgery     Family History  Problem Relation Age of Onset  . Asthma Other    History  Substance Use Topics  . Smoking status: Former Smoker -- 0.50 packs/day for 1.5 years    Types: Cigarettes  . Smokeless tobacco: Current User    Types: Snuff  . Alcohol Use: No    Review of Systems  Constitutional: Negative for activity change.       All ROS Neg except as noted in HPI  Eyes: Negative for photophobia and discharge.  Respiratory: Negative for cough, shortness of breath and wheezing.   Cardiovascular: Negative for chest pain and palpitations.  Gastrointestinal: Negative for abdominal pain and blood in stool.  Genitourinary: Negative for dysuria, frequency and hematuria.  Musculoskeletal: Positive for  arthralgias and stiffness. Negative for back pain and neck pain.  Skin: Negative.   Neurological: Negative for dizziness, seizures and speech difficulty.  Psychiatric/Behavioral: Negative for hallucinations and confusion.      Allergies  Review of patient's allergies indicates no known allergies.  Home Medications   Prior to Admission medications   Medication Sig Start Date End Date Taking? Authorizing Provider  albuterol (PROVENTIL HFA;VENTOLIN HFA) 108 (90 BASE) MCG/ACT inhaler Inhale 2 puffs into the lungs every 6 (six) hours as needed. For shortness of breath    Historical Provider, MD  amoxicillin (AMOXIL) 500 MG capsule Take 1 capsule (500 mg total) by mouth 3 (three) times daily. Patient not taking: Reported on 03/02/2014 08/17/12   Kathie DikeHobson M Lonni Dirden, PA-C  diclofenac (VOLTAREN) 75 MG EC tablet Take 1 tablet (75 mg total) by mouth 2 (two) times daily. 03/21/14   Kathie DikeHobson M Kamylle Axelson, PA-C  HYDROcodone-acetaminophen (NORCO/VICODIN) 5-325 MG per tablet Take 1 tablet by mouth every 4 (four) hours as needed. 03/21/14   Kathie DikeHobson M Danahi Reddish, PA-C  ibuprofen (ADVIL,MOTRIN) 600 MG tablet Take 1 tablet (600 mg total) by mouth every 6 (six) hours as needed. 03/02/14   Burgess AmorJulie Idol, PA-C  mupirocin cream (BACTROBAN) 2 % Apply topically 3 (three) times daily. Patient not taking: Reported on 03/02/2014 08/17/12   Kathie DikeHobson M Lindzie Boxx, PA-C   BP 128/65 mmHg  Pulse 102  Temp(Src) 98.2 F (36.8 C) (Oral)  Resp 24  Ht 6' (1.829 m)  Wt 260 lb (117.935 kg)  BMI 35.25 kg/m2  SpO2 99% Physical Exam  Constitutional: He is oriented to person, place, and time. He appears well-developed and well-nourished.  Non-toxic appearance.  HENT:  Head: Normocephalic.  Right Ear: Tympanic membrane and external ear normal.  Left Ear: Tympanic membrane and external ear normal.  Eyes: EOM and lids are normal. Pupils are equal, round, and reactive to light.  Neck: Normal range of motion. Neck supple. Carotid bruit is not present.   Cardiovascular: Normal rate, regular rhythm, normal heart sounds, intact distal pulses and normal pulses.   Pulmonary/Chest: Breath sounds normal. No respiratory distress.  Abdominal: Soft. Bowel sounds are normal. There is no tenderness. There is no guarding.  Musculoskeletal: Normal range of motion.  Pain there is good range of motion of the right hip. There is pain to even light touch of the right deep. No palpable effusion appreciated on limited examination. No posterior mass appreciated. No calf area pain. No deformity noted of the anterior tibial tuberosity. No deformity of the quadricep area. The Achilles tendon is intact. Dorsalis pedis pulses 2+.  Lymphadenopathy:       Head (right side): No submandibular adenopathy present.       Head (left side): No submandibular adenopathy present.    He has no cervical adenopathy.  Neurological: He is alert and oriented to person, place, and time. He has normal strength. No cranial nerve deficit or sensory deficit.  Skin: Skin is warm and dry.  Psychiatric: He has a normal mood and affect. His speech is normal.  Nursing note and vitals reviewed.   ED Course  Procedures (including critical care time) Labs Review Labs Reviewed - No data to display  Imaging Review Dg Knee Complete 4 Views Right  03/21/2014   CLINICAL DATA:  Status post fall.  Initially counter.  EXAM: RIGHT KNEE - COMPLETE 4+ VIEW  COMPARISON:  None.  FINDINGS: There is no evidence of fracture, dislocation, or joint effusion. There is no evidence of arthropathy or other focal bone abnormality. Soft tissues are unremarkable.  IMPRESSION: Negative.   Electronically Signed   By: Annia Beltrew  Davis M.D.   On: 03/21/2014 18:36     EKG Interpretation None      MDM  Patient states he hit his right knee on the floor of the basketball court and was limping when he got up, but now feels that the pain has progressed to where he cannot walk or put weight on it. The patient states he isn't  much pain to cooperate with complete knee evaluation.  X-ray of the knee is negative for fracture or dislocation or effusion.  The plan at this time is for the patient be placed in a knee immobilizer and issued crutches. He is given an ice pack. Prescription for diclofenac and Norco was given. The patient is referred to Dr. Magnus IvanBlackman for orthopedic evaluation.    Final diagnoses:  None    **I have reviewed nursing notes, vital signs, and all appropriate lab and imaging results for this patient.Kathie Dike*    Ory Elting M Narcissa Melder, PA-C 03/24/14 1804  Raeford RazorStephen Kohut, MD 03/28/14 [phone]

## 2014-05-26 ENCOUNTER — Encounter (HOSPITAL_COMMUNITY): Payer: Self-pay | Admitting: *Deleted

## 2014-05-26 ENCOUNTER — Emergency Department (HOSPITAL_COMMUNITY)
Admission: EM | Admit: 2014-05-26 | Discharge: 2014-05-26 | Disposition: A | Discharge status: Home / Self Care | Payer: Medicaid Other | Attending: Emergency Medicine | Admitting: Emergency Medicine

## 2014-05-26 DIAGNOSIS — L0501 Pilonidal cyst with abscess: Secondary | ICD-10-CM | POA: Insufficient documentation

## 2014-05-26 DIAGNOSIS — Z792 Long term (current) use of antibiotics: Secondary | ICD-10-CM | POA: Diagnosis not present

## 2014-05-26 DIAGNOSIS — Z791 Long term (current) use of non-steroidal anti-inflammatories (NSAID): Secondary | ICD-10-CM | POA: Insufficient documentation

## 2014-05-26 DIAGNOSIS — L0231 Cutaneous abscess of buttock: Secondary | ICD-10-CM | POA: Diagnosis present

## 2014-05-26 DIAGNOSIS — Z8659 Personal history of other mental and behavioral disorders: Secondary | ICD-10-CM | POA: Diagnosis not present

## 2014-05-26 DIAGNOSIS — J45909 Unspecified asthma, uncomplicated: Secondary | ICD-10-CM | POA: Diagnosis not present

## 2014-05-26 DIAGNOSIS — Z720 Tobacco use: Secondary | ICD-10-CM | POA: Insufficient documentation

## 2014-05-26 MED ORDER — DOXYCYCLINE HYCLATE 100 MG PO CAPS: 100.0000 mg | ORAL_CAPSULE | Freq: Two times a day (BID) | ORAL | Status: DC

## 2014-05-26 MED ORDER — OXYCODONE-ACETAMINOPHEN 5-325 MG PO TABS
1.0000 | ORAL_TABLET | Freq: Once | ORAL | Status: AC
  Administered 2014-05-26: 1 via ORAL
  Filled 2014-05-26: qty 1

## 2014-05-26 MED ORDER — DOXYCYCLINE HYCLATE 100 MG PO TABS
100.0000 mg | ORAL_TABLET | Freq: Once | ORAL | Status: AC
  Administered 2014-05-26: 100 mg via ORAL
  Filled 2014-05-26: qty 1

## 2014-05-26 MED ORDER — HYDROCODONE-ACETAMINOPHEN 5-325 MG PO TABS: 1.0000 | ORAL_TABLET | ORAL | Status: DC | PRN

## 2014-05-26 MED ORDER — ALBUTEROL SULFATE HFA 108 (90 BASE) MCG/ACT IN AERS: 2.0000 | INHALATION_SPRAY | RESPIRATORY_TRACT | Status: AC | PRN

## 2014-05-26 MED ORDER — NAPROXEN 500 MG PO TABS: 500.0000 mg | ORAL_TABLET | Freq: Two times a day (BID) | ORAL | Status: DC

## 2014-05-26 NOTE — ED Provider Notes (Signed)
CSN: [id number]     Arrival date & time 05/26/14  2101 History   First MD Initiated Contact with Patient 05/26/14 2131     Chief Complaint  Patient presents with  . Abscess     (Consider location/radiation/quality/duration/timing/severity/associated sxs/prior Treatment) HPI Ronnie Kelly is a 19 y.o. male who presents to the ED with abscess to the buttock. He states he had a similar place last year and was treated with antibiotics and it went away. The area came back a few days ago and is getting worse.  Patient also request refill on Albuterol Inhaler. He has Medicaid but has not been to the doctor that is on his card.  Past Medical History  Diagnosis Date  . Asthma   . Bipolar disorder    Past Surgical History  Procedure Laterality Date  . Nose surgery     Family History  Problem Relation Age of Onset  . Asthma Other    History  Substance Use Topics  . Smoking status: Current Every Day Smoker -- 0.50 packs/day for 1.5 years    Types: Cigarettes  . Smokeless tobacco: Current User    Types: Snuff  . Alcohol Use: No    Review of Systems Negative except as stated in HPI   Allergies  Review of patient's allergies indicates no known allergies.  Home Medications   Prior to Admission medications   Medication Sig Start Date End Date Taking? Authorizing Provider  albuterol (PROVENTIL HFA;VENTOLIN HFA) 108 (90 BASE) MCG/ACT inhaler Inhale 2 puffs into the lungs every 6 (six) hours as needed. For shortness of breath    Historical Provider, MD  doxycycline (VIBRAMYCIN) 100 MG capsule Take 1 capsule (100 mg total) by mouth 2 (two) times daily. 05/26/14   Isaias Dowson Orlene Och, NP  HYDROcodone-acetaminophen (NORCO/VICODIN) 5-325 MG per tablet Take 1 tablet by mouth every 4 (four) hours as needed. 05/26/14   Zacari Stiff Orlene Och, NP  naproxen (NAPROSYN) 500 MG tablet Take 1 tablet (500 mg total) by mouth 2 (two) times daily. 05/26/14   Nyshaun Standage Orlene Och, NP   BP 126/75 mmHg  Pulse 97  Temp(Src) 98.6 F  (37 C) (Oral)  Resp 20  Ht  (1.854 m)  Wt 245 lb (111.131 kg)  BMI 32.33 kg/m2  SpO2 99% Physical Exam  Constitutional: He is oriented to person, place, and time. He appears well-developed and well-nourished. No distress.  HENT:  Head: Normocephalic.  Eyes: EOM are normal.  Neck: Normal range of motion. Neck supple.  Cardiovascular: Normal rate.   Pulmonary/Chest: Effort normal.  Genitourinary:  There is a tender area at the base of the coccyx area and a dimple that has a tiny amount of bloody drainage.   Musculoskeletal: Normal range of motion.  Neurological: He is alert and oriented to person, place, and time. No cranial nerve deficit.  Skin: Skin is warm and dry.  Psychiatric: He has a normal mood and affect. His behavior is normal.  Nursing note and vitals reviewed.   ED Course  Procedures (including critical care time) I discussed with the patient I&D here tonight and follow up with the general surgeon vs antibiotics, pain management and follow up. Patient states that the area is to tender to have anything done tonight. He request antibiotics and pain medication and he will sit in warm tubs of water. Discussed that his insurance may require that he see his PCP and be given a referral from there.   MDM  19 y.o. male  with pain and swelling to the pilonidal area. Stable for d/c to follow up with general surgery. Will start antibiotics and pain medication. Discussed with the patient and all questioned fully answered.  Final diagnoses:  Cyst, pilonidal, with abscess      Janne NapoleonHope M Anuel Sitter, NP 05/26/14 [id number]  Vida RollerBrian D Miller, MD 05/26/14 [phone]

## 2014-05-26 NOTE — ED Notes (Signed)
Abscess to buttocks  

## 2014-05-26 NOTE — Discharge Instructions (Signed)
°  We are starting you on antibiotics and pain medication. Do not take the narcotic if you are driving as it will make you sleepy. You will need to follow up with your PCP to get a referral to a surgeon. I have given you the name of our surgeon here in [place].

## 2014-08-22 ENCOUNTER — Encounter (HOSPITAL_COMMUNITY): Payer: Self-pay | Admitting: Emergency Medicine

## 2014-08-22 ENCOUNTER — Emergency Department (HOSPITAL_COMMUNITY): Payer: Medicaid Other

## 2014-08-22 DIAGNOSIS — Z79899 Other long term (current) drug therapy: Secondary | ICD-10-CM | POA: Diagnosis not present

## 2014-08-22 DIAGNOSIS — Y92310 Basketball court as the place of occurrence of the external cause: Secondary | ICD-10-CM | POA: Diagnosis not present

## 2014-08-22 DIAGNOSIS — J45909 Unspecified asthma, uncomplicated: Secondary | ICD-10-CM | POA: Diagnosis not present

## 2014-08-22 DIAGNOSIS — Z8659 Personal history of other mental and behavioral disorders: Secondary | ICD-10-CM | POA: Insufficient documentation

## 2014-08-22 DIAGNOSIS — S99922A Unspecified injury of left foot, initial encounter: Secondary | ICD-10-CM | POA: Diagnosis present

## 2014-08-22 DIAGNOSIS — S93602A Unspecified sprain of left foot, initial encounter: Secondary | ICD-10-CM | POA: Diagnosis not present

## 2014-08-22 DIAGNOSIS — Y9367 Activity, basketball: Secondary | ICD-10-CM | POA: Diagnosis not present

## 2014-08-22 DIAGNOSIS — Z720 Tobacco use: Secondary | ICD-10-CM | POA: Diagnosis not present

## 2014-08-22 DIAGNOSIS — Y998 Other external cause status: Secondary | ICD-10-CM | POA: Diagnosis not present

## 2014-08-22 DIAGNOSIS — W01198A Fall on same level from slipping, tripping and stumbling with subsequent striking against other object, initial encounter: Secondary | ICD-10-CM | POA: Diagnosis not present

## 2014-08-22 NOTE — ED Notes (Signed)
Pt injured left foot playing basketball.

## 2014-08-23 ENCOUNTER — Emergency Department (HOSPITAL_COMMUNITY)
Admission: EM | Admit: 2014-08-23 | Discharge: 2014-08-23 | Disposition: A | Discharge status: Home / Self Care | Payer: Medicaid Other | Attending: Emergency Medicine | Admitting: Emergency Medicine

## 2014-08-23 DIAGNOSIS — S93602A Unspecified sprain of left foot, initial encounter: Secondary | ICD-10-CM

## 2014-08-23 MED ORDER — DICLOFENAC SODIUM 50 MG PO TBEC: 50.0000 mg | DELAYED_RELEASE_TABLET | Freq: Two times a day (BID) | ORAL | Status: DC

## 2014-08-23 MED ORDER — HYDROCODONE-ACETAMINOPHEN 5-325 MG PO TABS: 1.0000 | ORAL_TABLET | ORAL | Status: DC | PRN

## 2014-08-23 MED ORDER — HYDROCODONE-ACETAMINOPHEN 5-325 MG PO TABS
1.0000 | ORAL_TABLET | Freq: Once | ORAL | Status: AC
  Administered 2014-08-23: 1 via ORAL
  Filled 2014-08-23: qty 1

## 2014-08-23 MED ORDER — IBUPROFEN 800 MG PO TABS
800.0000 mg | ORAL_TABLET | Freq: Once | ORAL | Status: AC
  Administered 2014-08-23: 800 mg via ORAL
  Filled 2014-08-23: qty 1

## 2014-08-23 NOTE — ED Provider Notes (Signed)
CSN: [id number]     Arrival date & time 08/22/14  2315 History   First MD Initiated Contact with Patient 08/23/14 0022     Chief Complaint  Patient presents with  . Foot Pain     (Consider location/radiation/quality/duration/timing/severity/associated sxs/prior Treatment) Patient is a 19 y.o. male presenting with lower extremity pain. The history is provided by the patient.  Foot Pain This is a new problem. The current episode started today. The problem occurs constantly. The problem has been gradually worsening.   Ronnie Kelly is a 19 y.o. male who presents to the ED with left foot pain. He states that he was playing basketball on the street and jumped up and when he came down his foot hit a concrete slab and he turned his foot inward. He complains of pain and swelling. He has taken nothing for pain. He denies any other injuries.   Past Medical History  Diagnosis Date  . Asthma   . Bipolar disorder    Past Surgical History  Procedure Laterality Date  . Nose surgery     Family History  Problem Relation Age of Onset  . Asthma Other    History  Substance Use Topics  . Smoking status: Current Every Day Smoker -- 0.50 packs/day for 1.5 years    Types: Cigarettes  . Smokeless tobacco: Current User    Types: Snuff  . Alcohol Use: No    Review of Systems Negative except as stated in HPI   Allergies  Review of patient's allergies indicates no known allergies.  Home Medications   Prior to Admission medications   Medication Sig Start Date End Date Taking? Authorizing Provider  albuterol (PROVENTIL HFA;VENTOLIN HFA) 108 (90 BASE) MCG/ACT inhaler Inhale 2 puffs into the lungs every 4 (four) hours as needed for wheezing or shortness of breath. 05/26/14   Hope Orlene Och, NP  diclofenac (VOLTAREN) 50 MG EC tablet Take 1 tablet (50 mg total) by mouth 2 (two) times daily. 08/23/14   Hope Orlene Och, NP  HYDROcodone-acetaminophen (NORCO/VICODIN) 5-325 MG per tablet Take 1 tablet by mouth  every 4 (four) hours as needed. 08/23/14   Hope Orlene Och, NP   BP 120/69 mmHg  Pulse 90  Temp(Src) 98.7 F (37.1 C)  Resp 18  Ht 6' (1.829 m)  Wt 250 lb (113.399 kg)  BMI 33.90 kg/m2  SpO2 98% Physical Exam  Constitutional: He is oriented to person, place, and time. He appears well-developed and well-nourished. No distress.  HENT:  Head: Normocephalic.  Eyes: Conjunctivae and EOM are normal.  Neck: Neck supple.  Cardiovascular: Normal rate.   Pulmonary/Chest: Effort normal.  Musculoskeletal:       Left foot: There is tenderness and swelling. There is normal capillary refill, no deformity and no laceration.  Pedal pulse is 2+, adequate circulation, good touch sensation. Pain to the lateral aspect of the left foot with palpation and range of motion.   Neurological: He is alert and oriented to person, place, and time. No cranial nerve deficit.  Skin: Skin is warm and dry.  Psychiatric: He has a normal mood and affect. His behavior is normal.  Nursing note and vitals reviewed.   ED Course  Procedures (including critical care time) Labs Review Labs Reviewed - No data to display  Imaging Review Dg Foot Complete Left  08/23/2014   CLINICAL DATA:  Status post basketball injury. Heard pop at left foot, with lateral left foot pain. Initial encounter.  EXAM: LEFT FOOT - COMPLETE  3+ VIEW  COMPARISON:  None.  FINDINGS: There is no evidence of fracture or dislocation. The joint spaces are preserved. There is no evidence of talar subluxation; the subtalar joint is unremarkable in appearance.  No significant soft tissue abnormalities are seen.  IMPRESSION: No evidence of fracture or dislocation.   Electronically Signed   By: Roanna RaiderJeffery  Chang M.D.   On: 08/23/2014 01:04    MDM  19 y.o. male with left foot pain that radiates to the left ankle. Stable for d/c without neurovascular compromise. Placed in ASO, crutches, pain management and return as needed for worsening symptoms. I have reviewed this  patient's vital signs, nurses notes, appropriate labs and imaging.  I have discussed findings with the patient and plan of care. He voices understanding and agrees with plan.  Final diagnoses:  Foot sprain, left, initial encounter       Janne NapoleonHope M Neese, NP 08/23/14 1701  Layla MawKristen N Ward, DO 08/24/14 [phone]

## 2014-08-23 NOTE — ED Notes (Signed)
Patient states he has crutches at home. Refused crutches

## 2015-01-24 ENCOUNTER — Encounter (HOSPITAL_COMMUNITY): Payer: Self-pay | Admitting: Emergency Medicine

## 2015-01-24 ENCOUNTER — Emergency Department (HOSPITAL_COMMUNITY)
Admission: EM | Admit: 2015-01-24 | Discharge: 2015-01-24 | Disposition: A | Discharge status: Home / Self Care | Payer: Medicaid Other | Attending: Emergency Medicine | Admitting: Emergency Medicine

## 2015-01-24 DIAGNOSIS — F17210 Nicotine dependence, cigarettes, uncomplicated: Secondary | ICD-10-CM | POA: Insufficient documentation

## 2015-01-24 DIAGNOSIS — L0591 Pilonidal cyst without abscess: Secondary | ICD-10-CM

## 2015-01-24 DIAGNOSIS — Z79899 Other long term (current) drug therapy: Secondary | ICD-10-CM | POA: Insufficient documentation

## 2015-01-24 DIAGNOSIS — J45909 Unspecified asthma, uncomplicated: Secondary | ICD-10-CM | POA: Insufficient documentation

## 2015-01-24 DIAGNOSIS — L0501 Pilonidal cyst with abscess: Secondary | ICD-10-CM | POA: Diagnosis not present

## 2015-01-24 DIAGNOSIS — Z8659 Personal history of other mental and behavioral disorders: Secondary | ICD-10-CM | POA: Diagnosis not present

## 2015-01-24 DIAGNOSIS — K6289 Other specified diseases of anus and rectum: Secondary | ICD-10-CM | POA: Diagnosis present

## 2015-01-24 DIAGNOSIS — Z791 Long term (current) use of non-steroidal anti-inflammatories (NSAID): Secondary | ICD-10-CM | POA: Insufficient documentation

## 2015-01-24 LAB — BASIC METABOLIC PANEL
ANION GAP: 9 (ref 5–15)
BUN: 19 mg/dL (ref 6–20)
CALCIUM: 9.1 mg/dL (ref 8.9–10.3)
CO2: 25 mmol/L (ref 22–32)
Chloride: 103 mmol/L (ref 101–111)
Creatinine, Ser: 0.88 mg/dL (ref 0.61–1.24)
GFR calc non Af Amer: >60 mL/min (ref >60)
Glucose, Bld: 110 mg/dL — ABNORMAL HIGH (ref 65–99)
POTASSIUM: 3.6 mmol/L (ref 3.5–5.1)
SODIUM: 137 mmol/L (ref 135–145)

## 2015-01-24 LAB — CBC WITH DIFFERENTIAL/PLATELET
BASOS ABS: 0 10*3/uL (ref 0.0–0.1)
BASOS PCT: 0 %
EOS ABS: 0.3 10*3/uL (ref 0.0–0.7)
EOS PCT: 5 %
HCT: 41.9 % (ref 39.0–52.0)
Hemoglobin: 14.8 g/dL (ref 13.0–17.0)
Lymphocytes Relative: 32 %
Lymphs Abs: 2.4 10*3/uL (ref 0.7–4.0)
MCH: 30.6 pg (ref 26.0–34.0)
MCHC: 35.3 g/dL (ref 30.0–36.0)
MCV: 86.6 fL (ref 78.0–100.0)
MONO ABS: 0.6 10*3/uL (ref 0.1–1.0)
Monocytes Relative: 8 %
Neutro Abs: 4.2 10*3/uL (ref 1.7–7.7)
Neutrophils Relative %: 55 %
Platelets: 141 10*3/uL — ABNORMAL LOW (ref 150–400)
RBC: 4.84 MIL/uL (ref 4.22–5.81)
RDW: 13 % (ref 11.5–15.5)
WBC: 7.5 10*3/uL (ref 4.0–10.5)

## 2015-01-24 MED ORDER — SULFAMETHOXAZOLE-TRIMETHOPRIM 800-160 MG PO TABS: 1.0000 | ORAL_TABLET | Freq: Two times a day (BID) | ORAL | Status: AC

## 2015-01-24 MED ORDER — SULFAMETHOXAZOLE-TRIMETHOPRIM 800-160 MG PO TABS
1.0000 | ORAL_TABLET | Freq: Once | ORAL | Status: AC
  Administered 2015-01-24: 1 via ORAL
  Filled 2015-01-24: qty 1

## 2015-01-24 MED ORDER — CEPHALEXIN 500 MG PO CAPS: 500.0000 mg | ORAL_CAPSULE | Freq: Four times a day (QID) | ORAL | Status: DC

## 2015-01-24 MED ORDER — OXYCODONE-ACETAMINOPHEN 5-325 MG PO TABS: 1.0000 | ORAL_TABLET | ORAL | Status: DC | PRN

## 2015-01-24 MED ORDER — OXYCODONE-ACETAMINOPHEN 5-325 MG PO TABS
2.0000 | ORAL_TABLET | Freq: Once | ORAL | Status: AC
  Administered 2015-01-24: 2 via ORAL
  Filled 2015-01-24: qty 2

## 2015-01-24 NOTE — ED Notes (Signed)
Pt alert & oriented x4, stable gait. Patient given discharge instructions, paperwork & prescription(s). Patient informed not to drive, operate any equipment & handel any important documents 4 hours after taking pain medication. Patient  instructed to stop at the registration desk to finish any additional paperwork. Patient  verbalized understanding. Pt left department w/ no further questions. 

## 2015-01-24 NOTE — ED Notes (Signed)
Pt c/o abscess close to his rectum. Pt states it hurts to walk.

## 2015-01-24 NOTE — ED Notes (Signed)
Pt seen in ER a while back, referred to surgeon and was unable to follow up due to cost. States took all antibiotics. Cyst has returned.

## 2015-01-24 NOTE — Discharge Instructions (Signed)
Pilonidal Cyst  A pilonidal cyst is a fluid-filled sac. It forms beneath the skin near your tailbone, at the top of the crease of your buttocks. A pilonidal cyst that is not large or infected may not cause symptoms or problems.  If the cyst becomes irritated or infected, it may fill with pus. This causes pain and swelling (pilonidal abscess). An infected cyst may need to be treated with medicine, drained, or removed.  CAUSES  The cause of a pilonidal cyst is not known. One cause may be a hair that grows into your skin (ingrown hair).  RISK FACTORS  Pilonidal cysts are more common in boys and men. Risk factors include:  · Having lots of hair near the crease of the buttocks.  · Being overweight.  · Having a pilonidal dimple.  · Wearing tight clothing.  · Not bathing or showering frequently.  · Sitting for long periods of time.  SIGNS AND SYMPTOMS  Signs and symptoms of a pilonidal cyst may include:  · Redness.  · Pain and tenderness.  · Warmth.  · Swelling.  · Pus.  · Fever.  DIAGNOSIS  Your health care provider may diagnose a pilonidal cyst based on your symptoms and a physical exam. The health care provider may do a blood test to check for infection. If your cyst is draining pus, your health care provider may take a sample of the drainage to be tested at a laboratory.  TREATMENT  Surgery is the usual treatment for an infected pilonidal cyst. You may also have to take medicines before surgery. The type of surgery you have depends on the size and severity of the infected cyst. The different kinds of surgery include:  · Incision and drainage. This is a procedure to open and drain the cyst.  · Marsupialization. In this procedure, a large cyst or abscess may be opened and kept open by stitching the edges of the skin to the cyst walls.  · Cyst removal. This procedure involves opening the skin and removing all or part of the cyst.  HOME CARE INSTRUCTIONS  · Follow all of your surgeon's instructions carefully if you had  surgery.  · Take medicines only as directed by your health care provider.  · If you were prescribed an antibiotic medicine, finish it all even if you start to feel better.  · Keep the area around your pilonidal cyst clean and dry.  · Clean the area as directed by your health care provider. Pat the area dry with a clean towel. Do not rub it as this may cause bleeding.  · Remove hair from the area around the cyst as directed by your health care provider.  · Do not wear tight clothing or sit in one place for long periods of time.  · There are many different ways to close and cover an incision, including stitches, skin glue, and adhesive strips. Follow your health care provider's instructions on:    Incision care.    Bandage (dressing) changes and removal.    Incision closure removal.  SEEK MEDICAL CARE IF:   · You have drainage, redness, swelling, or pain at the site of the cyst.  · You have a fever.     This information is not intended to replace advice given to you by your health care provider. Make sure you discuss any questions you have with your health care provider.     Document Released: 03/08/2000 Document Revised: 04/01/2014 Document Reviewed: 07/29/2013  Elsevier Interactive Patient   Education ©2016 [employer]  -

## 2015-01-25 NOTE — ED Provider Notes (Signed)
CSN: [id number]     Arrival date & time 01/24/15  2040 History   First MD Initiated Contact with Patient 01/24/15 2053     Chief Complaint  Patient presents with  . Abscess     (Consider location/radiation/quality/duration/timing/severity/associated sxs/prior Treatment) HPI   Ronnie Kelly is a 19 y.o. male who presents to the Emergency Department complaining of pain and abscess to his rectal area.  He states this is a recurrent issue for him and he was seen here for same earlier this year and given antibiotic with some improvement.  He states he was advised to follow-up with a surgeon on previous visit, but states he wasn't able to f/u due to lack of funds for payment.  He states pain is similar to previous.  He denies fever, abdominal pain, chills, pain with defecation and vomiting.  Past Medical History  Diagnosis Date  . Asthma   . Bipolar disorder [facility])    Past Surgical History  Procedure Laterality Date  . Nose surgery     Family History  Problem Relation Age of Onset  . Asthma Other    Social History  Substance Use Topics  . Smoking status: Current Every Day Smoker -- 0.50 packs/day for 1.5 years    Types: Cigarettes  . Smokeless tobacco: Current User    Types: Snuff  . Alcohol Use: Yes    Review of Systems  Constitutional: Negative for fever and chills.  Gastrointestinal: Negative for nausea and vomiting.  Musculoskeletal: Negative for joint swelling and arthralgias.  Skin: Positive for color change.       Abscess   Hematological: Negative for adenopathy.  All other systems reviewed and are negative.     Allergies  Review of patient's allergies indicates no known allergies.  Home Medications   Prior to Admission medications   Medication Sig Start Date End Date Taking? Authorizing Provider  albuterol (PROVENTIL HFA;VENTOLIN HFA) 108 (90 BASE) MCG/ACT inhaler Inhale 2 puffs into the lungs every 4 (four) hours as needed for wheezing or shortness of  breath. 05/26/14   Hope Orlene Och, NP  cephALEXin (KEFLEX) 500 MG capsule Take 1 capsule (500 mg total) by mouth 4 (four) times daily. For 7 days 01/24/15   Ivin Rosenbloom, PA-C  diclofenac (VOLTAREN) 50 MG EC tablet Take 1 tablet (50 mg total) by mouth 2 (two) times daily. 08/23/14   Hope Orlene Och, NP  HYDROcodone-acetaminophen (NORCO/VICODIN) 5-325 MG per tablet Take 1 tablet by mouth every 4 (four) hours as needed. 08/23/14   Hope Orlene Och, NP  oxyCODONE-acetaminophen (PERCOCET/ROXICET) 5-325 MG tablet Take 1 tablet by mouth every 4 (four) hours as needed. 01/24/15   Roderic Lammert, PA-C  sulfamethoxazole-trimethoprim (BACTRIM DS,SEPTRA DS) 800-160 MG tablet Take 1 tablet by mouth 2 (two) times daily. For 10 days 01/24/15 01/31/15  Aayliah Rotenberry, PA-C   BP 128/70 mmHg  Pulse 91  Temp(Src) 98.3 F (36.8 C)  Resp 18  Ht  (1.854 m)  Wt 270 lb (122.471 kg)  BMI 35.63 kg/m2  SpO2 99% Physical Exam  Constitutional: He is oriented to person, place, and time. He appears well-developed and well-nourished. No distress.  HENT:  Head: Normocephalic and atraumatic.  Cardiovascular: Normal rate, regular rhythm and normal heart sounds.   No murmur heard. Pulmonary/Chest: Effort normal and breath sounds normal. No respiratory distress.  Abdominal: Soft. He exhibits no distension. There is no tenderness. There is no rebound and no guarding.  Neurological: He is alert and oriented to  person, place, and time. He exhibits normal muscle tone. Coordination normal.  Skin: Skin is warm and dry. No erythema.  Three small open areas to the natal cleft without obvious abscess.  No surrounding erythema  Nursing note and vitals reviewed.   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  Pt was offered I&D, but patient prefers to f/u with general surgery and try antibiotics to which he reports improvement of sx's in the past after abx treatment.    Labs reassuring, pt is non-toxic appearing.  Stable for d/c and he agrees to return here for any worsening sx's.    Pauline Ausammy Dasani Crear, PA-C 01/25/15 1212  Bethann BerkshireJoseph Zammit, MD 01/27/15 [phone]

## 2015-02-04 ENCOUNTER — Encounter (HOSPITAL_COMMUNITY): Payer: Self-pay | Admitting: *Deleted

## 2015-02-04 ENCOUNTER — Emergency Department (HOSPITAL_COMMUNITY): Payer: Medicaid Other

## 2015-02-04 ENCOUNTER — Emergency Department (HOSPITAL_COMMUNITY)
Admission: EM | Admit: 2015-02-04 | Discharge: 2015-02-05 | Disposition: A | Discharge status: Home / Self Care | Payer: Medicaid Other | Attending: Emergency Medicine | Admitting: Emergency Medicine

## 2015-02-04 DIAGNOSIS — Y92321 Football field as the place of occurrence of the external cause: Secondary | ICD-10-CM | POA: Diagnosis not present

## 2015-02-04 DIAGNOSIS — S8992XA Unspecified injury of left lower leg, initial encounter: Secondary | ICD-10-CM | POA: Diagnosis present

## 2015-02-04 DIAGNOSIS — W500XXA Accidental hit or strike by another person, initial encounter: Secondary | ICD-10-CM | POA: Diagnosis not present

## 2015-02-04 DIAGNOSIS — Z79899 Other long term (current) drug therapy: Secondary | ICD-10-CM | POA: Diagnosis not present

## 2015-02-04 DIAGNOSIS — S86811A Strain of other muscle(s) and tendon(s) at lower leg level, right leg, initial encounter: Secondary | ICD-10-CM | POA: Diagnosis not present

## 2015-02-04 DIAGNOSIS — Y9361 Activity, american tackle football: Secondary | ICD-10-CM | POA: Insufficient documentation

## 2015-02-04 DIAGNOSIS — F17210 Nicotine dependence, cigarettes, uncomplicated: Secondary | ICD-10-CM | POA: Diagnosis not present

## 2015-02-04 DIAGNOSIS — J45909 Unspecified asthma, uncomplicated: Secondary | ICD-10-CM | POA: Insufficient documentation

## 2015-02-04 DIAGNOSIS — F319 Bipolar disorder, unspecified: Secondary | ICD-10-CM | POA: Insufficient documentation

## 2015-02-04 DIAGNOSIS — Y998 Other external cause status: Secondary | ICD-10-CM | POA: Insufficient documentation

## 2015-02-04 DIAGNOSIS — S86912A Strain of unspecified muscle(s) and tendon(s) at lower leg level, left leg, initial encounter: Secondary | ICD-10-CM

## 2015-02-04 MED ORDER — IBUPROFEN 800 MG PO TABS
800.0000 mg | ORAL_TABLET | Freq: Once | ORAL | Status: AC
  Administered 2015-02-04: 800 mg via ORAL
  Filled 2015-02-04: qty 1

## 2015-02-04 MED ORDER — PROMETHAZINE HCL 12.5 MG PO TABS
12.5000 mg | ORAL_TABLET | Freq: Once | ORAL | Status: AC
  Administered 2015-02-04: 12.5 mg via ORAL
  Filled 2015-02-04: qty 1

## 2015-02-04 MED ORDER — ACETAMINOPHEN-CODEINE #3 300-30 MG PO TABS
2.0000 | ORAL_TABLET | Freq: Once | ORAL | Status: AC
  Administered 2015-02-04: 2 via ORAL
  Filled 2015-02-04: qty 2

## 2015-02-04 NOTE — ED Notes (Signed)
Pt c/o left knee injury. Pt states his knee popped after a big guy fell on him during a pick up football game.

## 2015-02-05 MED ORDER — IBUPROFEN 600 MG PO TABS: 600.0000 mg | ORAL_TABLET | Freq: Four times a day (QID) | ORAL | Status: DC | PRN

## 2015-02-05 MED ORDER — ACETAMINOPHEN-CODEINE #3 300-30 MG PO TABS: 1.0000 | ORAL_TABLET | Freq: Four times a day (QID) | ORAL | Status: DC | PRN

## 2015-02-05 NOTE — ED Notes (Signed)
Pt alert & oriented x4, stable gait. Patient given discharge instructions, paperwork & prescription(s). Patient informed not to drive, operate any equipment & handel any important documents 4 hours after taking pain medication. Patient  instructed to stop at the registration desk to finish any additional paperwork. Patient  verbalized understanding. Pt left department w/ no further questions. 

## 2015-02-05 NOTE — ED Provider Notes (Signed)
CSN: [id number]     Arrival date & time 02/04/15  2058 History   First MD Initiated Contact with Patient 02/04/15 2151     Chief Complaint  Patient presents with  . Knee Injury     (Consider location/radiation/quality/duration/timing/severity/associated sxs/prior Treatment) HPI Comments: Patient is a 19 year old male who presents to the emergency department with complaint of left knee pain.  The patient states he was playing football. When he tackled another player, the player rolled over on his left knee. The patient states he heard a pop, and he's been having pain in his knee since that time. He says it is worse when he moves the knee, or tips to put weight on it. He has not had any previous operations or procedures involving the left knee. There were no other injuries reported.  The history is provided by the patient.    Past Medical History  Diagnosis Date  . Asthma   . Bipolar disorder [facility])    Past Surgical History  Procedure Laterality Date  . Nose surgery     Family History  Problem Relation Age of Onset  . Asthma Other    Social History  Substance Use Topics  . Smoking status: Current Every Day Smoker -- 0.50 packs/day for 1.5 years    Types: Cigarettes  . Smokeless tobacco: Current User    Types: Snuff  . Alcohol Use: Yes    Review of Systems  Musculoskeletal: Positive for arthralgias.  Psychiatric/Behavioral:       Bipolar disorder  All other systems reviewed and are negative.     Allergies  Review of patient's allergies indicates no known allergies.  Home Medications   Prior to Admission medications   Medication Sig Start Date End Date Taking? Authorizing Provider  cephALEXin (KEFLEX) 500 MG capsule Take 1 capsule (500 mg total) by mouth 4 (four) times daily. For 7 days 01/24/15  Yes Tammy Triplett, PA-C  oxyCODONE-acetaminophen (PERCOCET/ROXICET) 5-325 MG tablet Take 1 tablet by mouth every 4 (four) hours as needed. 01/24/15  Yes Tammy Triplett,  PA-C  acetaminophen-codeine (TYLENOL #3) 300-30 MG tablet Take 1-2 tablets by mouth every 6 (six) hours as needed. 02/05/15   Ivery Quale, PA-C  albuterol (PROVENTIL HFA;VENTOLIN HFA) 108 (90 BASE) MCG/ACT inhaler Inhale 2 puffs into the lungs every 4 (four) hours as needed for wheezing or shortness of breath. 05/26/14   Hope Orlene Och, NP  diclofenac (VOLTAREN) 50 MG EC tablet Take 1 tablet (50 mg total) by mouth 2 (two) times daily. Patient not taking: Reported on 02/04/2015 08/23/14   Janne Napoleon, NP  HYDROcodone-acetaminophen (NORCO/VICODIN) 5-325 MG per tablet Take 1 tablet by mouth every 4 (four) hours as needed. Patient not taking: Reported on 02/04/2015 08/23/14   Janne Napoleon, NP  ibuprofen (ADVIL,MOTRIN) 600 MG tablet Take 1 tablet (600 mg total) by mouth every 6 (six) hours as needed. 02/05/15   Ivery Quale, PA-C   BP 132/71 mmHg  Pulse 94  Temp(Src) 98 F (36.7 C) (Oral)  Resp 18  Ht  (1.854 m)  Wt 245 lb (111.131 kg)  BMI 32.33 kg/m2  SpO2 100% Physical Exam  Constitutional: He is oriented to person, place, and time. He appears well-developed and well-nourished.  Non-toxic appearance.  HENT:  Head: Normocephalic.  Right Ear: Tympanic membrane and external ear normal.  Left Ear: Tympanic membrane and external ear normal.  Eyes: EOM and lids are normal. Pupils are equal, round, and reactive to light.  Neck: Normal  range of motion. Neck supple. Carotid bruit is not present.  Cardiovascular: Normal rate, regular rhythm, normal heart sounds, intact distal pulses and normal pulses.   Pulmonary/Chest: Breath sounds normal. No respiratory distress.  Abdominal: Soft. Bowel sounds are normal. There is no tenderness. There is no guarding.  Musculoskeletal: Normal range of motion.  There is pain to palpation of the medial and lateral aspect of the left knee. There is soreness over the tibial tuberosity on, but no bruising, and no hematoma noted. There is no effusion of the joint.  The patella is midline. There is no deformity of the tibial area. The Achilles tendon is intact. The dorsalis pedis pulses 2+. Capillary refill is less than 2 seconds.  Lymphadenopathy:       Head (right side): No submandibular adenopathy present.       Head (left side): No submandibular adenopathy present.    He has no cervical adenopathy.  Neurological: He is alert and oriented to person, place, and time. He has normal strength. No cranial nerve deficit or sensory deficit.  Skin: Skin is warm and dry.  Psychiatric: He has a normal mood and affect. His speech is normal.  Nursing note and vitals reviewed.   ED Course  Procedures (including critical care time) Labs Review Labs Reviewed - No data to display  Imaging Review Dg Knee Complete 4 Views Left  02/04/2015  CLINICAL DATA:  Left knee football injury.  Initial encounter. EXAM: LEFT KNEE - COMPLETE 4+ VIEW COMPARISON:  None. FINDINGS: There is no evidence of fracture, dislocation, or joint effusion. IMPRESSION: Negative. Electronically Signed   By: Marnee SpringJonathon  Watts M.D.   On: 02/04/2015 23:13   I have personally reviewed and evaluated these images and lab results as part of my medical decision-making.   EKG Interpretation None      MDM  Vital signs reviewed. X-ray of the left knee is negative for fracture, dislocation, or effusion. The patient is made aware of these findings. Patient is fitted with a knee immobilizer and crutches. Ice pack is been provided. A prescription for Tylenol codeine and Motrin to be given to the patient. Patient is referred to orthopedics for additional evaluation and management.    Final diagnoses:  Knee strain, left, initial encounter    **I have reviewed nursing notes, vital signs, and all appropriate lab and imaging results for this patient.Ivery Quale*    Clifton Kovacic, PA-C 02/05/15 0129  Lavera Guiseana Duo Liu, MD 02/05/15 1318

## 2015-02-05 NOTE — Discharge Instructions (Signed)
Please elevate your knee as much as possible. Apply ice. Use medication as suggested. Use crutches until able to safely apply weight to the affected knee. See Dr Romeo AppleHarrison as soon as possible for orthopedic evaluation.

## 2015-04-30 ENCOUNTER — Emergency Department (HOSPITAL_COMMUNITY)
Admission: EM | Admit: 2015-04-30 | Discharge: 2015-04-30 | Disposition: A | Discharge status: Home / Self Care | Payer: Medicaid Other | Attending: Emergency Medicine | Admitting: Emergency Medicine

## 2015-04-30 ENCOUNTER — Encounter (HOSPITAL_COMMUNITY): Payer: Self-pay | Admitting: Emergency Medicine

## 2015-04-30 DIAGNOSIS — L0501 Pilonidal cyst with abscess: Secondary | ICD-10-CM | POA: Diagnosis not present

## 2015-04-30 DIAGNOSIS — L0591 Pilonidal cyst without abscess: Secondary | ICD-10-CM

## 2015-04-30 DIAGNOSIS — Z79899 Other long term (current) drug therapy: Secondary | ICD-10-CM | POA: Insufficient documentation

## 2015-04-30 DIAGNOSIS — J45909 Unspecified asthma, uncomplicated: Secondary | ICD-10-CM | POA: Diagnosis not present

## 2015-04-30 DIAGNOSIS — F17210 Nicotine dependence, cigarettes, uncomplicated: Secondary | ICD-10-CM | POA: Insufficient documentation

## 2015-04-30 DIAGNOSIS — Z8659 Personal history of other mental and behavioral disorders: Secondary | ICD-10-CM | POA: Diagnosis not present

## 2015-04-30 MED ORDER — CEPHALEXIN 500 MG PO CAPS: 500.0000 mg | ORAL_CAPSULE | Freq: Four times a day (QID) | ORAL | Status: DC

## 2015-04-30 MED ORDER — SULFAMETHOXAZOLE-TRIMETHOPRIM 800-160 MG PO TABS
1.0000 | ORAL_TABLET | Freq: Once | ORAL | Status: AC
  Administered 2015-04-30: 1 via ORAL
  Filled 2015-04-30: qty 1

## 2015-04-30 MED ORDER — OXYCODONE-ACETAMINOPHEN 5-325 MG PO TABS
1.0000 | ORAL_TABLET | Freq: Once | ORAL | Status: AC
  Administered 2015-04-30: 1 via ORAL
  Filled 2015-04-30: qty 1

## 2015-04-30 MED ORDER — CEPHALEXIN 500 MG PO CAPS
500.0000 mg | ORAL_CAPSULE | Freq: Once | ORAL | Status: AC
  Administered 2015-04-30: 500 mg via ORAL
  Filled 2015-04-30: qty 1

## 2015-04-30 MED ORDER — SULFAMETHOXAZOLE-TRIMETHOPRIM 800-160 MG PO TABS: 1.0000 | ORAL_TABLET | Freq: Two times a day (BID) | ORAL | Status: AC

## 2015-04-30 MED ORDER — OXYCODONE-ACETAMINOPHEN 5-325 MG PO TABS: 1.0000 | ORAL_TABLET | ORAL | Status: DC | PRN

## 2015-04-30 NOTE — ED Notes (Signed)
PT states has pilondial cyst dx this past November and starting having pain and some drainage from same area this past week. PT states he tried to f/u with provider the ED referred him to but couldn't afford to be seen.

## 2015-04-30 NOTE — ED Provider Notes (Signed)
CSN: [id number]     Arrival date & time 04/30/15  1191 History  By signing my name below, I, Ronnie Kelly, attest that this documentation has been prepared under the direction and in the presence of Neal Trulson, PA-C. Electronically Signed: Elon Kelly ED Scribe. 04/30/2015. 7:14 PM.    Chief Complaint  Patient presents with  . Abscess   The history is provided by the patient. No language interpreter was used.   HPI Comments: Ronnie Kelly is a 20 y.o. male with hx of recurrent pilonidal cyst who presents to the Emergency Department complaining of constant, worsening pain and drainage from the rectal area.  He reports this episode started several weeks ago and worsened last week.  The patient was last seen in the ED in December for this complaint and rx'd percocet and antibiotic with resolution until this recurrence.  He was advised to f/u with Dr. Lovell Sheehan but was unable to due to financial reasons.  He denies fever, chills, abdominal pain and vomiting or bowel changes  Past Medical History  Diagnosis Date  . Asthma   . Bipolar disorder [facility])    Past Surgical History  Procedure Laterality Date  . Nose surgery     Family History  Problem Relation Age of Onset  . Asthma Other    Social History  Substance Use Topics  . Smoking status: Current Every Day Smoker -- 0.50 packs/day for 1.5 years    Types: Cigarettes  . Smokeless tobacco: Current User    Types: Snuff  . Alcohol Use: Yes    Review of Systems  Constitutional: Negative for fever and chills.  Gastrointestinal: Negative for nausea, vomiting, abdominal pain, blood in stool and anal bleeding.  Genitourinary: Negative for dysuria and difficulty urinating.  Musculoskeletal: Negative for back pain.  Skin: Negative for rash.   Allergies  Review of patient's allergies indicates no known allergies.  Home Medications   Prior to Admission medications   Medication Sig Start Date End Date Taking? Authorizing Provider   acetaminophen-codeine (TYLENOL #3) 300-30 MG tablet Take 1-2 tablets by mouth every 6 (six) hours as needed. 02/05/15   Ivery Quale, PA-C  albuterol (PROVENTIL HFA;VENTOLIN HFA) 108 (90 BASE) MCG/ACT inhaler Inhale 2 puffs into the lungs every 4 (four) hours as needed for wheezing or shortness of breath. 05/26/14   Hope Orlene Och, NP  cephALEXin (KEFLEX) 500 MG capsule Take 1 capsule (500 mg total) by mouth 4 (four) times daily. For 7 days 01/24/15   Devory Mckinzie, PA-C  diclofenac (VOLTAREN) 50 MG EC tablet Take 1 tablet (50 mg total) by mouth 2 (two) times daily. Patient not taking: Reported on 02/04/2015 08/23/14   Janne Napoleon, NP  HYDROcodone-acetaminophen (NORCO/VICODIN) 5-325 MG per tablet Take 1 tablet by mouth every 4 (four) hours as needed. Patient not taking: Reported on 02/04/2015 08/23/14   Janne Napoleon, NP  ibuprofen (ADVIL,MOTRIN) 600 MG tablet Take 1 tablet (600 mg total) by mouth every 6 (six) hours as needed. 02/05/15   Ivery Quale, PA-C  oxyCODONE-acetaminophen (PERCOCET/ROXICET) 5-325 MG tablet Take 1 tablet by mouth every 4 (four) hours as needed. 01/24/15   Khiry Pasquariello, PA-C   BP 134/74 mmHg  Pulse 82  Temp(Src) 98.4 F (36.9 C) (Oral)  Resp 16  Ht  (1.854 m)  Wt 245 lb (111.131 kg)  BMI 32.33 kg/m2  SpO2 100% Physical Exam  Constitutional: He is oriented to person, place, and time. He appears well-developed and well-nourished. No distress.  HENT:  Head: Normocephalic and atraumatic.  Neck: Neck supple.  Cardiovascular: Normal rate and regular rhythm.   Pulmonary/Chest: Effort normal and breath sounds normal. No respiratory distress.  Abdominal: Soft. He exhibits no distension. There is no tenderness. There is no rebound and no guarding.  Genitourinary:  3 small openings along the gluteal cleft without induration, erythema, or fluctuance.  Tenderness with palpation.  Slight drainage.  Musculoskeletal: Normal range of motion.  Neurological: He is alert and  oriented to person, place, and time.  Skin: Skin is warm and dry.  Psychiatric: He has a normal mood and affect. His behavior is normal.  Nursing note and vitals reviewed.   ED Course  Procedures (including critical care time)  DIAGNOSTIC STUDIES: Oxygen Saturation is 100% on RA, normal by my interpretation.    COORDINATION OF CARE:  7:16 PM Will prescribe antibiotic and pain medication.  Patient advised to f/u with surgeon and contact the hospital social worker for financial guidance.  Patient acknowledges and agrees with plan.    Labs Review Labs Reviewed - No data to display  Imaging Review No results found. I have personally reviewed and evaluated these images and lab results as part of my medical decision-making.   EKG Interpretation None      MDM   Final diagnoses:  Pilonidal cyst    Pt with recurrent pilonidal cysts.  No obvious abscess at this time.  Pt well appearing. No indication for I&D at this time.  rx for abx's and advised pt that he needs to arrange surgical f/u. Return precautions given  I personally performed the services described in this documentation, which was scribed in my presence. The recorded information has been reviewed and is accurate.    Pauline Aus, PA-C 05/02/15 2354  Loren Racer, MD 05/06/15 2245

## 2015-04-30 NOTE — Discharge Instructions (Signed)
Pilonidal Cyst  A pilonidal cyst is a fluid-filled sac. It forms beneath the skin near your tailbone, at the top of the crease of your buttocks. A pilonidal cyst that is not large or infected may not cause symptoms or problems.  If the cyst becomes irritated or infected, it may fill with pus. This causes pain and swelling (pilonidal abscess). An infected cyst may need to be treated with medicine, drained, or removed.  CAUSES  The cause of a pilonidal cyst is not known. One cause may be a hair that grows into your skin (ingrown hair).  RISK FACTORS  Pilonidal cysts are more common in boys and men. Risk factors include:  · Having lots of hair near the crease of the buttocks.  · Being overweight.  · Having a pilonidal dimple.  · Wearing tight clothing.  · Not bathing or showering frequently.  · Sitting for long periods of time.  SIGNS AND SYMPTOMS  Signs and symptoms of a pilonidal cyst may include:  · Redness.  · Pain and tenderness.  · Warmth.  · Swelling.  · Pus.  · Fever.  DIAGNOSIS  Your health care provider may diagnose a pilonidal cyst based on your symptoms and a physical exam. The health care provider may do a blood test to check for infection. If your cyst is draining pus, your health care provider may take a sample of the drainage to be tested at a laboratory.  TREATMENT  Surgery is the usual treatment for an infected pilonidal cyst. You may also have to take medicines before surgery. The type of surgery you have depends on the size and severity of the infected cyst. The different kinds of surgery include:  · Incision and drainage. This is a procedure to open and drain the cyst.  · Marsupialization. In this procedure, a large cyst or abscess may be opened and kept open by stitching the edges of the skin to the cyst walls.  · Cyst removal. This procedure involves opening the skin and removing all or part of the cyst.  HOME CARE INSTRUCTIONS  · Follow all of your surgeon's instructions carefully if you had  surgery.  · Take medicines only as directed by your health care provider.  · If you were prescribed an antibiotic medicine, finish it all even if you start to feel better.  · Keep the area around your pilonidal cyst clean and dry.  · Clean the area as directed by your health care provider. Pat the area dry with a clean towel. Do not rub it as this may cause bleeding.  · Remove hair from the area around the cyst as directed by your health care provider.  · Do not wear tight clothing or sit in one place for long periods of time.  · There are many different ways to close and cover an incision, including stitches, skin glue, and adhesive strips. Follow your health care provider's instructions on:    Incision care.    Bandage (dressing) changes and removal.    Incision closure removal.  SEEK MEDICAL CARE IF:   · You have drainage, redness, swelling, or pain at the site of the cyst.  · You have a fever.     This information is not intended to replace advice given to you by your health care provider. Make sure you discuss any questions you have with your health care provider.     Document Released: 03/08/2000 Document Revised: 04/01/2014 Document Reviewed: 07/29/2013  Elsevier Interactive Patient   Education ©2016 [employer]  -

## 2015-05-09 ENCOUNTER — Emergency Department (HOSPITAL_COMMUNITY): Payer: Medicaid Other

## 2015-05-09 ENCOUNTER — Encounter (HOSPITAL_COMMUNITY): Payer: Self-pay

## 2015-05-09 ENCOUNTER — Emergency Department (HOSPITAL_COMMUNITY)
Admission: EM | Admit: 2015-05-09 | Discharge: 2015-05-09 | Disposition: A | Discharge status: Home / Self Care | Payer: Medicaid Other | Attending: Emergency Medicine | Admitting: Emergency Medicine

## 2015-05-09 DIAGNOSIS — Y9367 Activity, basketball: Secondary | ICD-10-CM | POA: Diagnosis not present

## 2015-05-09 DIAGNOSIS — Z79899 Other long term (current) drug therapy: Secondary | ICD-10-CM | POA: Insufficient documentation

## 2015-05-09 DIAGNOSIS — W1839XA Other fall on same level, initial encounter: Secondary | ICD-10-CM | POA: Diagnosis not present

## 2015-05-09 DIAGNOSIS — Z8659 Personal history of other mental and behavioral disorders: Secondary | ICD-10-CM | POA: Diagnosis not present

## 2015-05-09 DIAGNOSIS — Y92310 Basketball court as the place of occurrence of the external cause: Secondary | ICD-10-CM | POA: Insufficient documentation

## 2015-05-09 DIAGNOSIS — F17210 Nicotine dependence, cigarettes, uncomplicated: Secondary | ICD-10-CM | POA: Insufficient documentation

## 2015-05-09 DIAGNOSIS — Y998 Other external cause status: Secondary | ICD-10-CM | POA: Diagnosis not present

## 2015-05-09 DIAGNOSIS — S93402A Sprain of unspecified ligament of left ankle, initial encounter: Secondary | ICD-10-CM | POA: Diagnosis not present

## 2015-05-09 DIAGNOSIS — J45909 Unspecified asthma, uncomplicated: Secondary | ICD-10-CM | POA: Diagnosis not present

## 2015-05-09 DIAGNOSIS — S99912A Unspecified injury of left ankle, initial encounter: Secondary | ICD-10-CM | POA: Diagnosis present

## 2015-05-09 MED ORDER — IBUPROFEN 600 MG PO TABS: 600.0000 mg | ORAL_TABLET | Freq: Four times a day (QID) | ORAL | Status: DC | PRN

## 2015-05-09 MED ORDER — TRAMADOL HCL 50 MG PO TABS: 50.0000 mg | ORAL_TABLET | Freq: Four times a day (QID) | ORAL | Status: DC | PRN

## 2015-05-09 MED ORDER — IBUPROFEN 800 MG PO TABS
800.0000 mg | ORAL_TABLET | Freq: Once | ORAL | Status: AC
  Administered 2015-05-09: 800 mg via ORAL
  Filled 2015-05-09: qty 1

## 2015-05-09 NOTE — Discharge Instructions (Signed)
Ankle Sprain  An ankle sprain is an injury to the strong, fibrous tissues (ligaments) that hold the bones of your ankle joint together.   CAUSES  An ankle sprain is usually caused by a fall or by twisting your ankle. Ankle sprains most commonly occur when you step on the outer edge of your foot, and your ankle turns inward. People who participate in sports are more prone to these types of injuries.   SYMPTOMS    Pain in your ankle. The pain may be present at rest or only when you are trying to stand or walk.   Swelling.   Bruising. Bruising may develop immediately or within 1 to 2 days after your injury.   Difficulty standing or walking, particularly when turning corners or changing directions.  DIAGNOSIS   Your caregiver will ask you details about your injury and perform a physical exam of your ankle to determine if you have an ankle sprain. During the physical exam, your caregiver will press on and apply pressure to specific areas of your foot and ankle. Your caregiver will try to move your ankle in certain ways. An X-ray exam may be done to be sure a bone was not broken or a ligament did not separate from one of the bones in your ankle (avulsion fracture).   TREATMENT   Certain types of braces can help stabilize your ankle. Your caregiver can make a recommendation for this. Your caregiver may recommend the use of medicine for pain. If your sprain is severe, your caregiver may refer you to a surgeon who helps to restore function to parts of your skeletal system (orthopedist) or a physical therapist.  HOME CARE INSTRUCTIONS    Apply ice to your injury for 1-2 days or as directed by your caregiver. Applying ice helps to reduce inflammation and pain.    Put ice in a plastic bag.    Place a towel between your skin and the bag.    Leave the ice on for 15-20 minutes at a time, every 2 hours while you are awake.   Only take over-the-counter or prescription medicines for pain, discomfort, or fever as directed by  your caregiver.   Elevate your injured ankle above the level of your heart as much as possible for 2-3 days.   If your caregiver recommends crutches, use them as instructed. Gradually put weight on the affected ankle. Continue to use crutches or a cane until you can walk without feeling pain in your ankle.   If you have a plaster splint, wear the splint as directed by your caregiver. Do not rest it on anything harder than a pillow for the first 24 hours. Do not put weight on it. Do not get it wet. You may take it off to take a shower or bath.   You may have been given an elastic bandage to wear around your ankle to provide support. If the elastic bandage is too tight (you have numbness or tingling in your foot or your foot becomes cold and blue), adjust the bandage to make it comfortable.   If you have an air splint, you may blow more air into it or let air out to make it more comfortable. You may take your splint off at night and before taking a shower or bath. Wiggle your toes in the splint several times per day to decrease swelling.  SEEK MEDICAL CARE IF:    You have rapidly increasing bruising or swelling.   Your toes feel   extremely cold or you lose feeling in your foot.   Your pain is not relieved with medicine.  SEEK IMMEDIATE MEDICAL CARE IF:   Your toes are numb or blue.   You have severe pain that is increasing.  MAKE SURE YOU:    Understand these instructions.   Will watch your condition.   Will get help right away if you are not doing well or get worse.     This information is not intended to replace advice given to you by your health care provider. Make sure you discuss any questions you have with your health care provider.     Document Released: 03/11/2005 Document Revised: 04/01/2014 Document Reviewed: 03/23/2011  Elsevier Interactive Patient Education 2016 [employer]

## 2015-05-09 NOTE — ED Provider Notes (Signed)
CSN: [id number]     Arrival date & time 05/09/15  2029 History   First MD Initiated Contact with Patient 05/09/15 2152     Chief Complaint  Patient presents with  . Ankle Pain     (Consider location/radiation/quality/duration/timing/severity/associated sxs/prior Treatment) The history is provided by the patient.   Ronnie Kelly is a 20 y.o. male presenting with left ankle and foot pain which occurred suddenly when the patient landed on his feet during a basketball game, but felt immediate pain and popping sensation in the left medial foot and ankle causing a fall.  Pain is aching, constant and worse with palpation, movement and weight bearing.  The patient was unable to weight bear immediately after the event.  There is radiation of pain into the lower anterior leg and the patient denies numbness distal to the injury site.  He has had no treatment prior to arrival.   Past Medical History  Diagnosis Date  . Asthma   . Bipolar disorder [facility])    Past Surgical History  Procedure Laterality Date  . Nose surgery     Family History  Problem Relation Age of Onset  . Asthma Other    Social History  Substance Use Topics  . Smoking status: Current Every Day Smoker -- 0.50 packs/day for 1.5 years    Types: Cigarettes  . Smokeless tobacco: Current User    Types: Snuff  . Alcohol Use: Yes    Review of Systems  Musculoskeletal: Positive for arthralgias. Negative for joint swelling.  Skin: Negative for wound.  Neurological: Negative for weakness and numbness.      Allergies  Review of patient's allergies indicates no known allergies.  Home Medications   Prior to Admission medications   Medication Sig Start Date End Date Taking? Authorizing Provider  acetaminophen-codeine (TYLENOL #3) 300-30 MG tablet Take 1-2 tablets by mouth every 6 (six) hours as needed. 02/05/15   Ivery Quale, PA-C  albuterol (PROVENTIL HFA;VENTOLIN HFA) 108 (90 BASE) MCG/ACT inhaler Inhale 2 puffs into  the lungs every 4 (four) hours as needed for wheezing or shortness of breath. 05/26/14   Hope Orlene Och, NP  cephALEXin (KEFLEX) 500 MG capsule Take 1 capsule (500 mg total) by mouth 4 (four) times daily. For 7 days 04/30/15   Tammy Triplett, PA-C  diclofenac (VOLTAREN) 50 MG EC tablet Take 1 tablet (50 mg total) by mouth 2 (two) times daily. Patient not taking: Reported on 02/04/2015 08/23/14   Janne Napoleon, NP  HYDROcodone-acetaminophen (NORCO/VICODIN) 5-325 MG per tablet Take 1 tablet by mouth every 4 (four) hours as needed. Patient not taking: Reported on 02/04/2015 08/23/14   Janne Napoleon, NP  ibuprofen (ADVIL,MOTRIN) 600 MG tablet Take 1 tablet (600 mg total) by mouth every 6 (six) hours as needed. 05/09/15   Burgess Amor, PA-C  oxyCODONE-acetaminophen (PERCOCET/ROXICET) 5-325 MG tablet Take 1 tablet by mouth every 4 (four) hours as needed. 04/30/15   Tammy Triplett, PA-C  traMADol (ULTRAM) 50 MG tablet Take 1 tablet (50 mg total) by mouth every 6 (six) hours as needed. 05/09/15   Burgess Amor, PA-C   BP 112/59 mmHg  Pulse 97  Temp(Src) 98.8 F (37.1 C) (Oral)  Resp 18  Ht  (1.854 m)  Wt 113.399 kg  BMI 32.99 kg/m2  SpO2 98% Physical Exam  Constitutional: He appears well-developed and well-nourished.  HENT:  Head: Normocephalic.  Cardiovascular: Normal rate and intact distal pulses.  Exam reveals no decreased pulses.   Pulses:  Dorsalis pedis pulses are 2+ on the right side, and 2+ on the left side.       Posterior tibial pulses are 2+ on the right side, and 2+ on the left side.  Musculoskeletal: He exhibits tenderness. He exhibits no edema.       Left ankle: He exhibits no swelling, no ecchymosis, no deformity and normal pulse. Tenderness. Medial malleolus tenderness found. No posterior TFL, no head of 5th metatarsal and no proximal fibula tenderness found. Achilles tendon normal.       Feet:  Neurological: He is alert. No sensory deficit.  Skin: Skin is warm, dry and intact.    Nursing note and vitals reviewed.   ED Course  Procedures (including critical care time) Labs Review Labs Reviewed - No data to display  Imaging Review Dg Tibia/fibula Left  05/09/2015  CLINICAL DATA:  Basketball injury today. Patient rolled is left ankle and fell, twisting the leg. EXAM: LEFT TIBIA AND FIBULA - 2 VIEW COMPARISON:  Left knee 02/04/2015 FINDINGS: There is no evidence of fracture or other focal bone lesions. Soft tissues are unremarkable. IMPRESSION: Negative. Electronically Signed   By: Burman Nieves M.D.   On: 05/09/2015 21:15   Dg Ankle Complete Left  05/09/2015  CLINICAL DATA:  Twisting injury left ankle playing basketball today. Pain. Initial encounter. EXAM: LEFT ANKLE COMPLETE - 3+ VIEW COMPARISON:  None. FINDINGS: There is no evidence of fracture, dislocation, or joint effusion. There is no evidence of arthropathy or other focal bone abnormality. Soft tissues are unremarkable. IMPRESSION: Negative exam. Electronically Signed   By: Drusilla Kanner M.D.   On: 05/09/2015 21:14   Dg Foot Complete Left  05/09/2015  CLINICAL DATA:  20 year old male status post left foot and ankle injury sustained while playing basketball. EXAM: LEFT FOOT - COMPLETE 3+ VIEW COMPARISON:  Concurrently obtained radiographs of the left ankle FINDINGS: There is no evidence of fracture or dislocation. There is no evidence of arthropathy or other focal bone abnormality. Soft tissues are unremarkable. IMPRESSION: Negative. Electronically Signed   By: Malachy Moan M.D.   On: 05/09/2015 22:39   I have personally reviewed and evaluated these images and lab results as part of my medical decision-making.   EKG Interpretation None      MDM   Final diagnoses:  Ankle sprain, left, initial encounter    ASO and crutches provided.  Cap refill normal after ASO applied.  RICE, referral to pcp to establish care and for f/u if injury persists despite tx.  The patient appears reasonably screened  and/or stabilized for discharge and I doubt any other medical condition or other [facility] requiring further screening, evaluation, or treatment in the ED at this time prior to discharge.      Burgess Amor, PA-C 05/09/15 2319  Bethann Berkshire, MD 05/09/15 [phone]

## 2015-05-09 NOTE — ED Notes (Signed)
Left ankle injury PTA while playing basketball

## 2015-07-09 ENCOUNTER — Encounter (HOSPITAL_COMMUNITY): Payer: Self-pay

## 2015-07-09 ENCOUNTER — Emergency Department (HOSPITAL_COMMUNITY)
Admission: EM | Admit: 2015-07-09 | Discharge: 2015-07-10 | Discharge status: Against Medical Advice | Payer: Medicaid Other | Attending: Emergency Medicine | Admitting: Emergency Medicine

## 2015-07-09 DIAGNOSIS — M79606 Pain in leg, unspecified: Secondary | ICD-10-CM

## 2015-07-09 DIAGNOSIS — R200 Anesthesia of skin: Secondary | ICD-10-CM | POA: Diagnosis not present

## 2015-07-09 DIAGNOSIS — F319 Bipolar disorder, unspecified: Secondary | ICD-10-CM | POA: Diagnosis not present

## 2015-07-09 DIAGNOSIS — M79662 Pain in left lower leg: Secondary | ICD-10-CM | POA: Diagnosis not present

## 2015-07-09 DIAGNOSIS — M79661 Pain in right lower leg: Secondary | ICD-10-CM | POA: Diagnosis not present

## 2015-07-09 DIAGNOSIS — J45909 Unspecified asthma, uncomplicated: Secondary | ICD-10-CM | POA: Diagnosis not present

## 2015-07-09 DIAGNOSIS — M2550 Pain in unspecified joint: Secondary | ICD-10-CM | POA: Insufficient documentation

## 2015-07-09 DIAGNOSIS — R531 Weakness: Secondary | ICD-10-CM | POA: Diagnosis present

## 2015-07-09 NOTE — ED Notes (Signed)
3 days ago I was at work and my legs started getting weak.  They were throbbing and hurting, felt like I was going to lose functioning in my legs.  I have a knot on each lower leg.  The legs are still bothering me.

## 2015-07-09 NOTE — ED Provider Notes (Signed)
CSN: [id number]     Arrival date & time 07/09/15  2218 History  By signing my name below, I, Marisue Humble, attest that this documentation has been prepared under the direction and in the presence of Devoria Albe, MD at 0028 AM. Electronically Signed: Marisue Humble, Scribe. 07/10/2015. 12:56 AM.   Chief Complaint  Patient presents with  . Extremity Weakness   The history is provided by the patient. No language interpreter was used.   HPI Comments:  Ronnie Kelly is a 20 y.o. male who presents to the Emergency Department complaining of one episode of numbness in entire left leg lasting 2-3 minutes three days ago while working. He states it made him fall. Pt reports associated pain in left shin since the episode of numbness, pain in right lateral knee, and intermittent numbness in left leg lasting a few seconds. He also states he found "knots in his legs" where he is feeling pain. No alleviating factors noted or treatments attempted PTA. Pt is a current 0.5 packs per day smoker; occasional ETOH use. 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 Denies trauma to legs, numbness in right leg, back pain, fever, difficulty urinating, bowel or bladder incontinence, difficulty walking, or any other symptoms at this time.  PCP none  Past Medical History  Diagnosis Date  . Asthma   . Bipolar disorder [facility])    Past Surgical History  Procedure Laterality Date  . Nose surgery     Family History  Problem Relation Age of Onset  . Asthma Other    Social History  Substance Use Topics  . Smoking status: Current Every Day Smoker -- 0.50 packs/day for 1.5 years    Types: Cigarettes  . Smokeless tobacco: Current User    Types: Snuff  . Alcohol Use: Yes  employed  Review of Systems  Constitutional: Negative for fever.  Genitourinary: Negative for difficulty urinating.  Musculoskeletal: Positive for arthralgias. Negative for back pain and gait problem.  Neurological: Positive for numbness.  All other  systems reviewed and are negative.  Allergies  Review of patient's allergies indicates no known allergies.  Home Medications   Prior to Admission medications   Medication Sig Start Date End Date Taking? Authorizing Provider  acetaminophen-codeine (TYLENOL #3) 300-30 MG tablet Take 1-2 tablets by mouth every 6 (six) hours as needed. 02/05/15   Ivery Quale, PA-C  albuterol (PROVENTIL HFA;VENTOLIN HFA) 108 (90 BASE) MCG/ACT inhaler Inhale 2 puffs into the lungs every 4 (four) hours as needed for wheezing or shortness of breath. 05/26/14   Hope Orlene Och, NP  cephALEXin (KEFLEX) 500 MG capsule Take 1 capsule (500 mg total) by mouth 4 (four) times daily. For 7 days 04/30/15   Tammy Triplett, PA-C  diclofenac (VOLTAREN) 50 MG EC tablet Take 1 tablet (50 mg total) by mouth 2 (two) times daily. Patient not taking: Reported on 02/04/2015 08/23/14   Janne Napoleon, NP  HYDROcodone-acetaminophen (NORCO/VICODIN) 5-325 MG per tablet Take 1 tablet by mouth every 4 (four) hours as needed. Patient not taking: Reported on 02/04/2015 08/23/14   Janne Napoleon, NP  ibuprofen (ADVIL,MOTRIN) 600 MG tablet Take 1 tablet (600 mg total) by mouth every 6 (six) hours as needed. 05/09/15   Burgess Amor, PA-C  oxyCODONE-acetaminophen (PERCOCET/ROXICET) 5-325 MG tablet Take 1 tablet by mouth every 4 (four) hours as needed. 04/30/15   Tammy Triplett, PA-C  traMADol (ULTRAM) 50 MG tablet Take 1 tablet (50 mg total) by mouth every 6 (six) hours as needed. 05/09/15  Raynelle FanningJulie Idol, PA-C   BP 132/79 mmHg  Pulse 79  Temp(Src) 98 F (36.7 C) (Oral)  Resp 18  Ht 6\' 2"  (1.88 m)  Wt 245 lb (111.131 kg)  BMI 31.44 kg/m2  SpO2 99%  Vital signs normal   Physical Exam  Constitutional: He is oriented to person, place, and time. He appears well-developed and well-nourished.  Non-toxic appearance. He does not appear ill. No distress.  HENT:  Head: Normocephalic and atraumatic.  Right Ear: External ear normal.  Left Ear: External ear normal.   Nose: Nose normal. No mucosal edema or rhinorrhea.  Mouth/Throat: Oropharynx is clear and moist and mucous membranes are normal. No dental abscesses or uvula swelling.  Eyes: Conjunctivae and EOM are normal. Pupils are equal, round, and reactive to light.  Neck: Normal range of motion and full passive range of motion without pain. Neck supple.  Cardiovascular: Normal rate, regular rhythm and normal heart sounds.  Exam reveals no gallop and no friction rub.   No murmur heard. Pulmonary/Chest: Effort normal and breath sounds normal. No respiratory distress. He has no wheezes. He has no rhonchi. He has no rales. He exhibits no tenderness and no crepitus.  Abdominal: Soft. Normal appearance and bowel sounds are normal. He exhibits no distension. There is no tenderness. There is no rebound and no guarding.  Musculoskeletal: Normal range of motion.       Legs: Moves all extremities well.  Lower extremities: on left mid anterior shin there is a visible swollen area ~3cm in size that is TTP without redness or warmth to the skin; on right he has an area on proximal lateral tibia that is not visibly swollen and I do not easily feel any subcutaneous mass; pt states he feels it and has some pain. Both knees are non-tender without joint effusions.  Neurological: He is alert and oriented to person, place, and time. He has normal strength. No cranial nerve deficit.  Skin: Skin is warm, dry and intact. No rash noted. No erythema. No pallor.  Psychiatric: He has a normal mood and affect. His speech is normal and behavior is normal. His mood appears not anxious.  Nursing note and vitals reviewed.   ED Course  Procedures  DIAGNOSTIC STUDIES:  Oxygen Saturation is 99% on RA, normal by my interpretation.    COORDINATION OF CARE:  12:35 AM Will order blood work and imaging of legs. Discussed treatment plan with pt at bedside and pt agreed to plan.  Laboratory testing was ordered including a sedimentation rate,  comprehensive medical panel and CBC with differential. I was considering whether patient had erythema nodosum since he denies any trauma in his legs when he fell when his leg was numb. X-rays of his left tib-fib and his right knee were ordered.  12:46 AM Pt seen ambulating without difficulty in ED after checking out AMA.    MDM   Final diagnoses:  Pain of lower extremity, unspecified laterality    Pt left AMA  I personally performed the services described in this documentation, which was scribed in my presence. The recorded information has been reviewed and considered.  Devoria AlbeIva Teresita Fanton, MD, Concha PyoFACEP    Capricia Serda, MD 07/10/15 [phone]

## 2015-07-10 ENCOUNTER — Emergency Department (HOSPITAL_COMMUNITY): Payer: Medicaid Other

## 2015-07-10 MED ORDER — KETOROLAC TROMETHAMINE 60 MG/2ML IM SOLN: 60.0000 mg | Freq: Once | INTRAMUSCULAR | Status: DC

## 2015-11-29 ENCOUNTER — Emergency Department (HOSPITAL_COMMUNITY)
Admission: EM | Admit: 2015-11-29 | Discharge: 2015-11-29 | Disposition: A | Discharge status: Home / Self Care | Payer: Medicaid Other | Attending: Emergency Medicine | Admitting: Emergency Medicine

## 2015-11-29 ENCOUNTER — Encounter (HOSPITAL_COMMUNITY): Payer: Self-pay | Admitting: *Deleted

## 2015-11-29 DIAGNOSIS — R55 Syncope and collapse: Secondary | ICD-10-CM | POA: Diagnosis not present

## 2015-11-29 DIAGNOSIS — Z79899 Other long term (current) drug therapy: Secondary | ICD-10-CM | POA: Insufficient documentation

## 2015-11-29 DIAGNOSIS — F17210 Nicotine dependence, cigarettes, uncomplicated: Secondary | ICD-10-CM | POA: Diagnosis not present

## 2015-11-29 DIAGNOSIS — T7840XA Allergy, unspecified, initial encounter: Secondary | ICD-10-CM | POA: Diagnosis not present

## 2015-11-29 DIAGNOSIS — J45909 Unspecified asthma, uncomplicated: Secondary | ICD-10-CM | POA: Insufficient documentation

## 2015-11-29 DIAGNOSIS — R21 Rash and other nonspecific skin eruption: Secondary | ICD-10-CM | POA: Diagnosis present

## 2015-11-29 LAB — CBC WITH DIFFERENTIAL/PLATELET
BASOS ABS: 0 10*3/uL (ref 0.0–0.1)
BASOS PCT: 0 %
EOS PCT: 1 %
Eosinophils Absolute: 0.1 10*3/uL (ref 0.0–0.7)
HEMATOCRIT: 49.9 % (ref 39.0–52.0)
Hemoglobin: 17.6 g/dL — ABNORMAL HIGH (ref 13.0–17.0)
Lymphocytes Relative: 19 %
Lymphs Abs: 1.9 10*3/uL (ref 0.7–4.0)
MCH: 30.3 pg (ref 26.0–34.0)
MCHC: 35.3 g/dL (ref 30.0–36.0)
MCV: 85.9 fL (ref 78.0–100.0)
MONO ABS: 0.6 10*3/uL (ref 0.1–1.0)
Monocytes Relative: 5 %
NEUTROS ABS: 7.7 10*3/uL (ref 1.7–7.7)
Neutrophils Relative %: 75 %
PLATELETS: 160 10*3/uL (ref 150–400)
RBC: 5.81 MIL/uL (ref 4.22–5.81)
RDW: 13.7 % (ref 11.5–15.5)
WBC: 10.2 10*3/uL (ref 4.0–10.5)

## 2015-11-29 LAB — COMPREHENSIVE METABOLIC PANEL
ALBUMIN: 5 g/dL (ref 3.5–5.0)
ALT: 28 U/L (ref 17–63)
AST: 23 U/L (ref 15–41)
Alkaline Phosphatase: 79 U/L (ref 38–126)
Anion gap: 10 (ref 5–15)
BUN: 17 mg/dL (ref 6–20)
CHLORIDE: 102 mmol/L (ref 101–111)
CO2: 27 mmol/L (ref 22–32)
Calcium: 9.8 mg/dL (ref 8.9–10.3)
Creatinine, Ser: 1.25 mg/dL — ABNORMAL HIGH (ref 0.61–1.24)
GFR calc Af Amer: >60 mL/min (ref >60)
GFR calc non Af Amer: >60 mL/min (ref >60)
GLUCOSE: 112 mg/dL — AB (ref 65–99)
POTASSIUM: 3.8 mmol/L (ref 3.5–5.1)
SODIUM: 139 mmol/L (ref 135–145)
Total Bilirubin: 0.7 mg/dL (ref 0.3–1.2)
Total Protein: 8.1 g/dL (ref 6.5–8.1)

## 2015-11-29 MED ORDER — METHYLPREDNISOLONE SODIUM SUCC 125 MG IJ SOLR
125.0000 mg | Freq: Once | INTRAMUSCULAR | Status: AC
  Administered 2015-11-29: 125 mg via INTRAVENOUS
  Filled 2015-11-29: qty 2

## 2015-11-29 MED ORDER — SODIUM CHLORIDE 0.9 % IV BOLUS (SEPSIS)
1000.0000 mL | Freq: Once | INTRAVENOUS | Status: AC
  Administered 2015-11-29: 1000 mL via INTRAVENOUS

## 2015-11-29 MED ORDER — PREDNISONE 50 MG PO TABS: ORAL_TABLET | ORAL | 0 refills | Status: DC

## 2015-11-29 MED ORDER — FAMOTIDINE IN NACL 20-0.9 MG/50ML-% IV SOLN
20.0000 mg | Freq: Once | INTRAVENOUS | Status: AC
  Administered 2015-11-29: 20 mg via INTRAVENOUS
  Filled 2015-11-29: qty 50

## 2015-11-29 MED ORDER — DIPHENHYDRAMINE HCL 50 MG/ML IJ SOLN
25.0000 mg | Freq: Once | INTRAMUSCULAR | Status: AC
  Administered 2015-11-29: 25 mg via INTRAVENOUS
  Filled 2015-11-29: qty 1

## 2015-11-29 MED ORDER — FAMOTIDINE 20 MG PO TABS: 20.0000 mg | ORAL_TABLET | Freq: Two times a day (BID) | ORAL | 0 refills | Status: DC

## 2015-11-29 MED ORDER — LORATADINE 10 MG PO TABS: 10.0000 mg | ORAL_TABLET | Freq: Every day | ORAL | 0 refills | Status: DC

## 2015-11-29 NOTE — ED Triage Notes (Signed)
Pt here for whelps all over that started about [place].  [facility] pt.  continued with SOB.  NO distress noted in triage.  Denies itching but pain

## 2015-11-29 NOTE — Discharge Instructions (Signed)
Prescriptions for prednisone, antihistamine, Pepcid for 7 days. Try to find a primary care doctor.

## 2015-11-29 NOTE — ED Provider Notes (Signed)
[facility] DEPT Provider Note   CSN: [id number] Arrival date & time: 11/29/15  1619     History   Chief Complaint Chief Complaint  Patient presents with  . Loss of Consciousness    HPI Ronnie Kelly is a 20 y.o. male.  Patient presents with intermittent rash for one month.  Symptoms started after patient was [place].  Rash is not associated with any activity, food, exposure. No itching. No known allergies. Additionally, patient states he was playing basketball today and passed out for brief period of time. No chest pain, dyspnea, neurological deficits.      Past Medical History:  Diagnosis Date  . Asthma   . Bipolar disorder (HCC)     There are no active problems to display for this patient.   Past Surgical History:  Procedure Laterality Date  . NOSE SURGERY         Home Medications    Prior to Admission medications   Medication Sig Start Date End Date Taking? Authorizing Provider  acetaminophen-codeine (TYLENOL #3) 300-30 MG tablet Take 1-2 tablets by mouth every 6 (six) hours as needed. 02/05/15   Ivery QualeHobson Bryant, PA-C  albuterol (PROVENTIL HFA;VENTOLIN HFA) 108 (90 BASE) MCG/ACT inhaler Inhale 2 puffs into the lungs every 4 (four) hours as needed for wheezing or shortness of breath. 05/26/14   Hope Orlene OchM Neese, NP  cephALEXin (KEFLEX) 500 MG capsule Take 1 capsule (500 mg total) by mouth 4 (four) times daily. For 7 days 04/30/15   Tammy Triplett, PA-C  diclofenac (VOLTAREN) 50 MG EC tablet Take 1 tablet (50 mg total) by mouth 2 (two) times daily. Patient not taking: Reported on 02/04/2015 08/23/14   Janne NapoleonHope M Neese, NP  famotidine (PEPCID) 20 MG tablet Take 1 tablet (20 mg total) by mouth 2 (two) times daily. 11/29/15   Donnetta HutchingBrian Aylene Acoff, MD  HYDROcodone-acetaminophen (NORCO/VICODIN) 5-325 MG per tablet Take 1 tablet by mouth every 4 (four) hours as needed. Patient not taking: Reported on 02/04/2015 08/23/14   Janne NapoleonHope M Neese, NP  ibuprofen (ADVIL,MOTRIN) 600 MG tablet  Take 1 tablet (600 mg total) by mouth every 6 (six) hours as needed. 05/09/15   Burgess AmorJulie Idol, PA-C  loratadine (CLARITIN) 10 MG tablet Take 1 tablet (10 mg total) by mouth daily. 11/29/15   Donnetta HutchingBrian Sesilia Poucher, MD  oxyCODONE-acetaminophen (PERCOCET/ROXICET) 5-325 MG tablet Take 1 tablet by mouth every 4 (four) hours as needed. 04/30/15   Tammy Triplett, PA-C  predniSONE (DELTASONE) 50 MG tablet 1 tablet for 7 days 11/29/15   Donnetta HutchingBrian Lacresha Fusilier, MD  traMADol (ULTRAM) 50 MG tablet Take 1 tablet (50 mg total) by mouth every 6 (six) hours as needed. 05/09/15   Burgess AmorJulie Idol, PA-C    Family History Family History  Problem Relation Age of Onset  . Asthma Other     Social History Social History  Substance Use Topics  . Smoking status: Current Every Day Smoker    Packs/day: 0.50    Years: 1.50    Types: Cigarettes  . Smokeless tobacco: Current User    Types: Snuff  . Alcohol use Yes     Allergies   Review of patient's allergies indicates no known allergies.   Review of Systems Review of Systems  All other systems reviewed and are negative.    Physical Exam Updated Vital Signs BP 132/78 (BP Location: Right Arm)   Pulse 89   Temp 97.8 F (36.6 C) (Oral)   Resp 16   Ht 6\' 1"  (1.854  m)   Wt 240 lb (108.9 kg)   SpO2 100%   BMI 31.66 kg/m   Physical Exam  Constitutional: He is oriented to person, place, and time. He appears well-developed and well-nourished.  HENT:  Head: Normocephalic and atraumatic.  No respiratory distress  Eyes: Conjunctivae are normal.  Neck: Neck supple.  Cardiovascular: Normal rate and regular rhythm.   Pulmonary/Chest: Effort normal and breath sounds normal.  Abdominal: Soft. Bowel sounds are normal.  Musculoskeletal: Normal range of motion.  Neurological: He is alert and oriented to person, place, and time.  Skin:  Diffuse erythematous wheals  Psychiatric: He has a normal mood and affect. His behavior is normal.  Nursing note and vitals reviewed.    ED Treatments /  Results  Labs (all labs ordered are listed, but only abnormal results are displayed) Labs Reviewed  CBC WITH DIFFERENTIAL/PLATELET - Abnormal; Notable for the following:       Result Value   Hemoglobin 17.6 (*)    All other components within normal limits  COMPREHENSIVE METABOLIC PANEL - Abnormal; Notable for the following:    Glucose, Bld 112 (*)    Creatinine, Ser 1.25 (*)    All other components within normal limits    EKG  EKG Interpretation  Date/Time:  Wednesday November 29 2015 17:46:27 EDT Ventricular Rate:  82 PR Interval:    QRS Duration: 90 QT Interval:  344 QTC Calculation: 402 R Axis:   67 Text Interpretation:  Sinus rhythm Confirmed by Adriana Simas  MD, Shaquinta Peruski ([id number]) on 11/29/2015 6:23:37 PM       Radiology No results found.  Procedures Procedures (including critical care time)  Medications Ordered in ED Medications  sodium chloride 0.9 % bolus 1,000 mL (1,000 mLs Intravenous New Bag/Given 11/29/15 1735)  methylPREDNISolone sodium succinate (SOLU-MEDROL) 125 mg/2 mL injection 125 mg (125 mg Intravenous Given 11/29/15 1730)  famotidine (PEPCID) IVPB 20 mg premix (0 mg Intravenous Stopped 11/29/15 1800)  diphenhydrAMINE (BENADRYL) injection 25 mg (25 mg Intravenous Given 11/29/15 1730)     Initial Impression / Assessment and Plan / ED Course  I have reviewed the triage vital signs and the nursing notes.  Pertinent labs & imaging results that were available during my care of the patient were reviewed by me and considered in my medical decision making (see chart for details).  Clinical Course    Patient responded well to IV Solu-Medrol, Pepcid, Benadryl. I'm uncertain of the etiology of this rash. Additionally, I have no good explanation for his syncopal spell today. He is slightly overweight and may be deconditioned. EKG was normal. Hemoglobin and glucose normal. Creatinine minimally elevated. Discharge medications prednisone, Claritin, Pepcid. He will get primary care  follow-up.  Final Clinical Impressions(s) / ED Diagnoses   Final diagnoses:  Allergic reaction, initial encounter    New Prescriptions New Prescriptions   FAMOTIDINE (PEPCID) 20 MG TABLET    Take 1 tablet (20 mg total) by mouth 2 (two) times daily.   LORATADINE (CLARITIN) 10 MG TABLET    Take 1 tablet (10 mg total) by mouth daily.   PREDNISONE (DELTASONE) 50 MG TABLET    1 tablet for 7 days     Donnetta Hutching, MD 11/29/15 [phone]

## 2016-05-04 IMAGING — DX DG FOOT COMPLETE 3+V*L*
3 series · 3 of 3 positions shown · non-contrast
Comparison: Concurrently obtained radiographs of the left ankle

CLINICAL DATA: 19-year-old male status post left foot and ankle
injury sustained while playing basketball.

EXAM:
LEFT FOOT - COMPLETE 3+ VIEW

[foot ap]
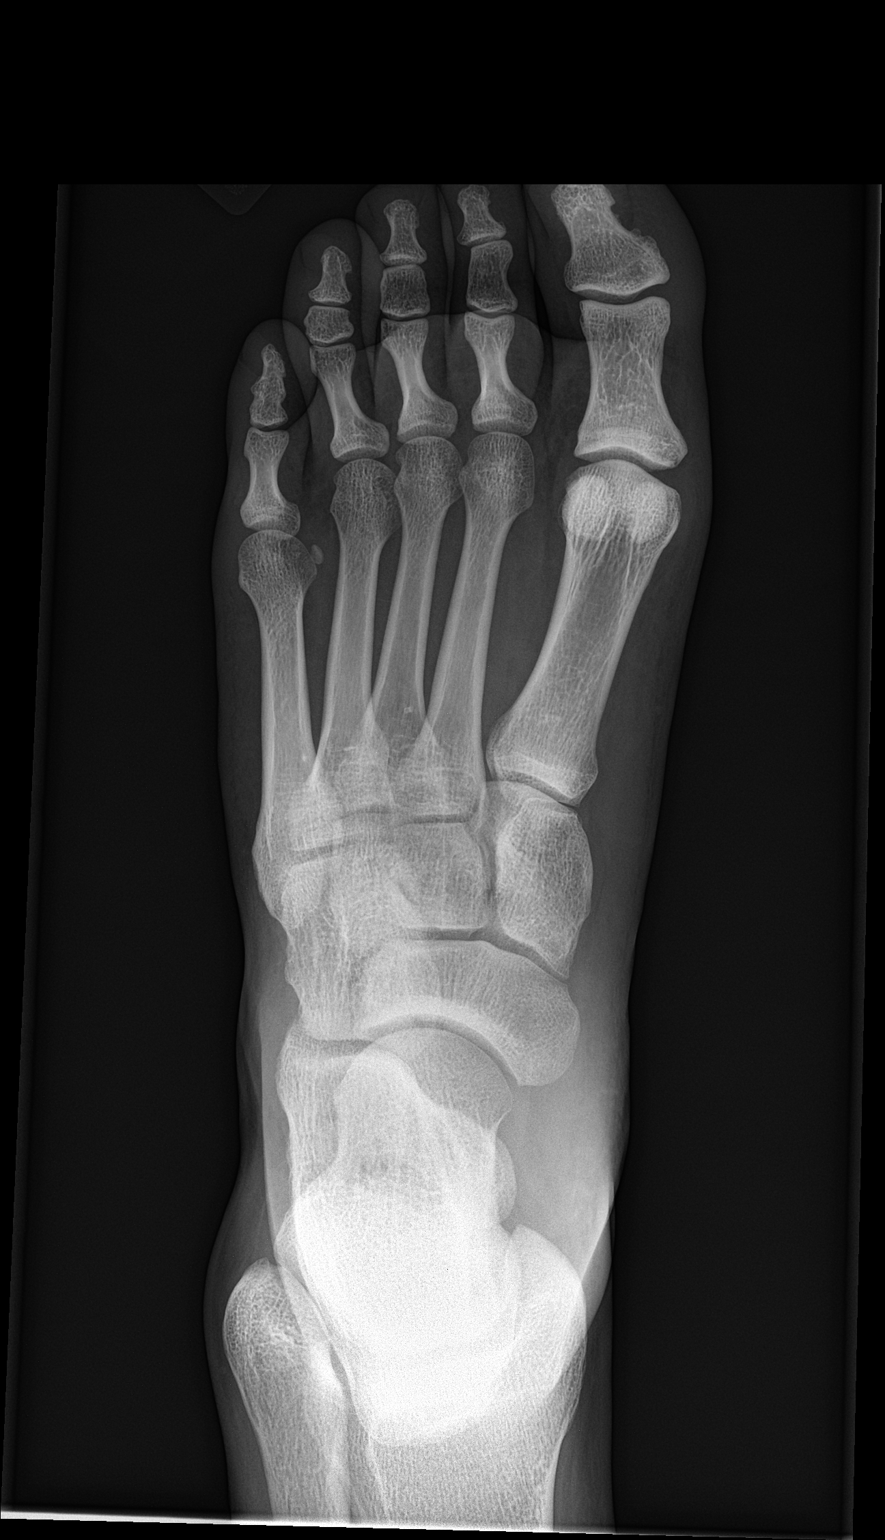

[foot obl]
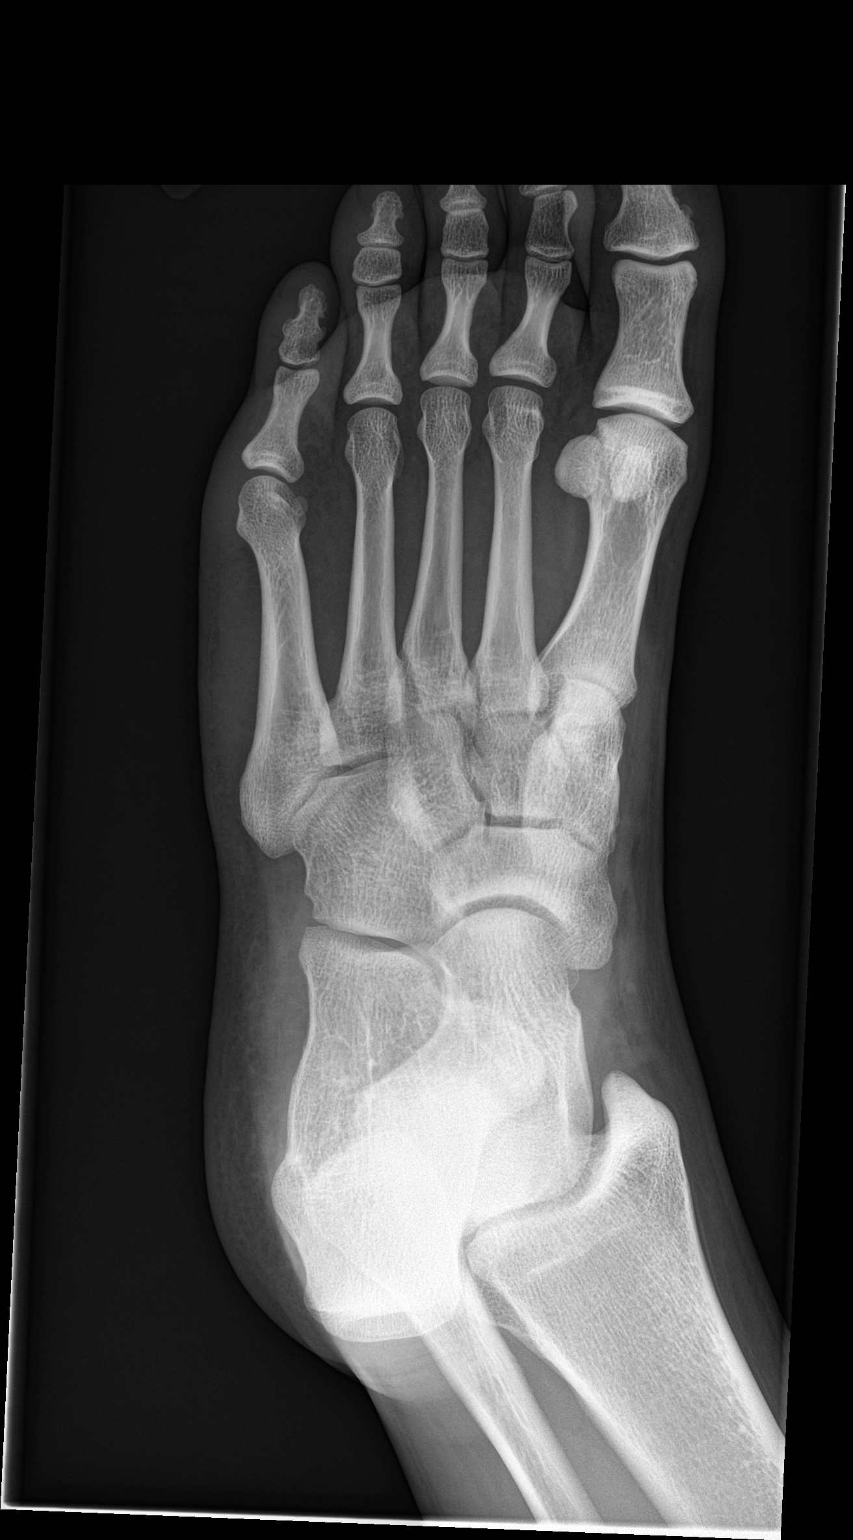

[foot lat]
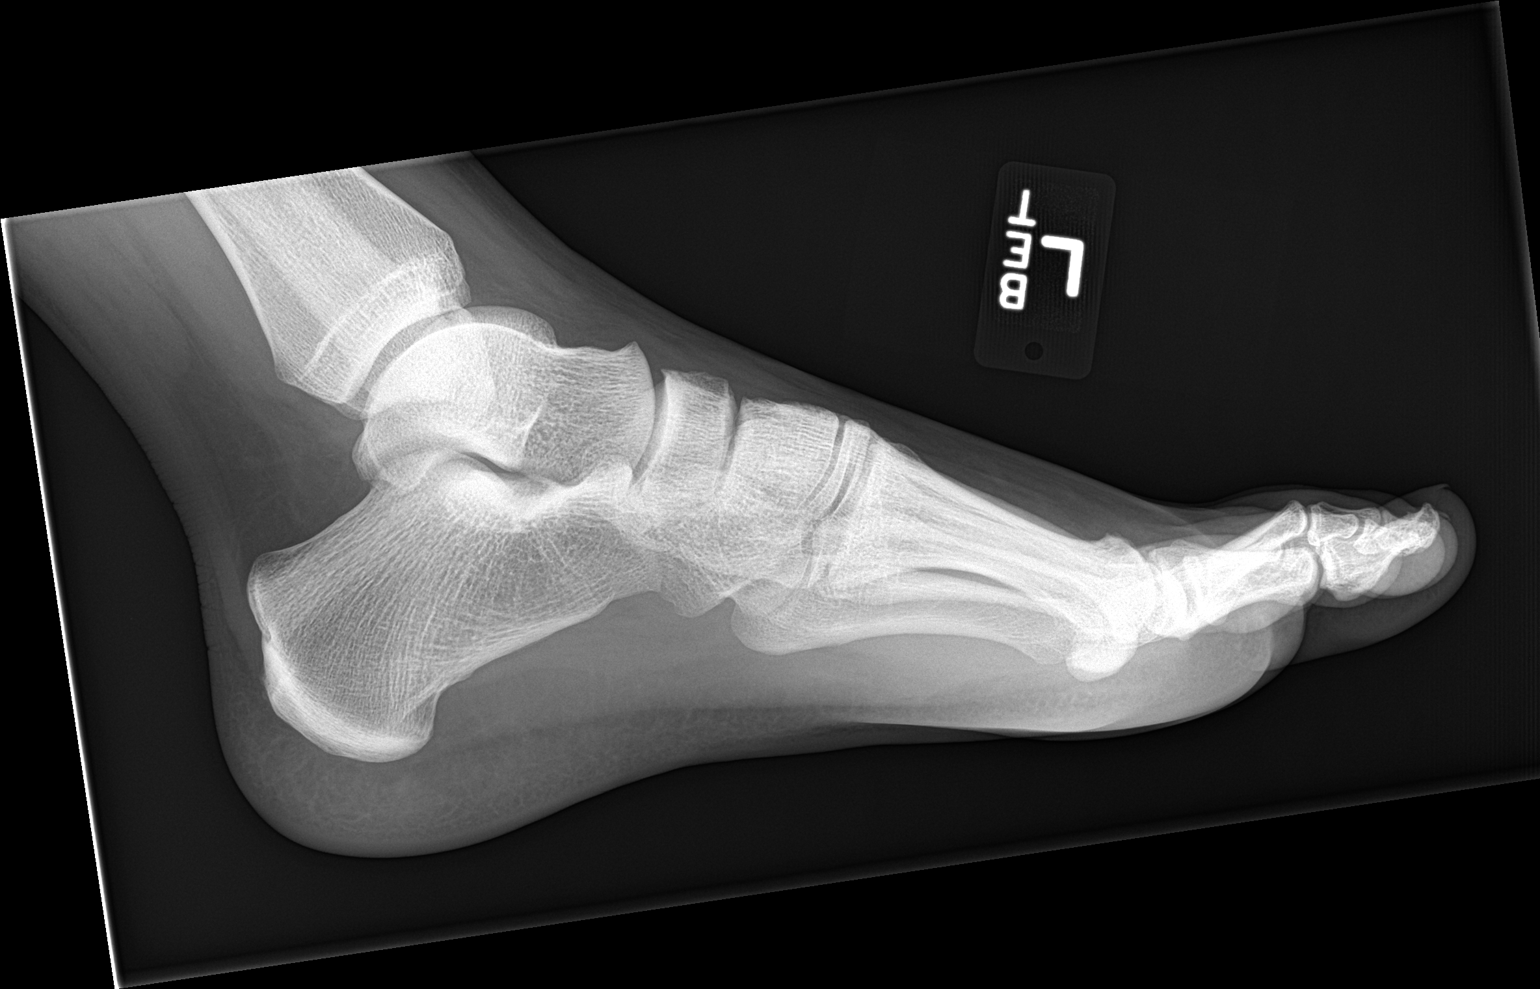

[3 of 3 positions shown; findings below may reference images not displayed]

FINDINGS: There is no evidence of fracture or dislocation. There is no
evidence of arthropathy or other focal bone abnormality. Soft
tissues are unremarkable.
IMPRESSION: Negative.

## 2016-10-12 ENCOUNTER — Emergency Department (HOSPITAL_COMMUNITY)
Admission: EM | Admit: 2016-10-12 | Discharge: 2016-10-12 | Disposition: A | Discharge status: Home / Self Care | Payer: Medicaid Other | Attending: Emergency Medicine | Admitting: Emergency Medicine

## 2016-10-12 ENCOUNTER — Encounter (HOSPITAL_COMMUNITY): Payer: Self-pay | Admitting: Emergency Medicine

## 2016-10-12 ENCOUNTER — Emergency Department (HOSPITAL_COMMUNITY): Payer: Medicaid Other

## 2016-10-12 DIAGNOSIS — K6289 Other specified diseases of anus and rectum: Secondary | ICD-10-CM | POA: Diagnosis not present

## 2016-10-12 DIAGNOSIS — R300 Dysuria: Secondary | ICD-10-CM | POA: Diagnosis not present

## 2016-10-12 DIAGNOSIS — J45909 Unspecified asthma, uncomplicated: Secondary | ICD-10-CM | POA: Insufficient documentation

## 2016-10-12 DIAGNOSIS — F17210 Nicotine dependence, cigarettes, uncomplicated: Secondary | ICD-10-CM | POA: Insufficient documentation

## 2016-10-12 LAB — BASIC METABOLIC PANEL
ANION GAP: 9 (ref 5–15)
BUN: 19 mg/dL (ref 6–20)
CHLORIDE: 107 mmol/L (ref 101–111)
CO2: 25 mmol/L (ref 22–32)
Calcium: 9.5 mg/dL (ref 8.9–10.3)
Creatinine, Ser: 0.92 mg/dL (ref 0.61–1.24)
GFR calc Af Amer: >60 mL/min (ref >60)
GLUCOSE: 102 mg/dL — AB (ref 65–99)
POTASSIUM: 3.7 mmol/L (ref 3.5–5.1)
Sodium: 141 mmol/L (ref 135–145)

## 2016-10-12 LAB — CBC WITH DIFFERENTIAL/PLATELET
BASOS ABS: 0 10*3/uL (ref 0.0–0.1)
Basophils Relative: 0 %
Eosinophils Absolute: 0.3 10*3/uL (ref 0.0–0.7)
Eosinophils Relative: 3 %
HEMATOCRIT: 39.9 % (ref 39.0–52.0)
Hemoglobin: 14.2 g/dL (ref 13.0–17.0)
LYMPHS PCT: 41 %
Lymphs Abs: 3.3 10*3/uL (ref 0.7–4.0)
MCH: 30.4 pg (ref 26.0–34.0)
MCHC: 35.6 g/dL (ref 30.0–36.0)
MCV: 85.4 fL (ref 78.0–100.0)
MONO ABS: 0.6 10*3/uL (ref 0.1–1.0)
Monocytes Relative: 7 %
NEUTROS ABS: 3.9 10*3/uL (ref 1.7–7.7)
Neutrophils Relative %: 49 %
Platelets: 148 10*3/uL — ABNORMAL LOW (ref 150–400)
RBC: 4.67 MIL/uL (ref 4.22–5.81)
RDW: 13 % (ref 11.5–15.5)
WBC: 8.2 10*3/uL (ref 4.0–10.5)

## 2016-10-12 LAB — URINALYSIS, ROUTINE W REFLEX MICROSCOPIC
Bilirubin Urine: NEGATIVE
GLUCOSE, UA: NEGATIVE mg/dL
Hgb urine dipstick: NEGATIVE
Ketones, ur: NEGATIVE mg/dL
LEUKOCYTES UA: NEGATIVE
Nitrite: NEGATIVE
Protein, ur: NEGATIVE mg/dL
Specific Gravity, Urine: 1.031 — ABNORMAL HIGH (ref 1.005–1.030)
pH: 5 (ref 5.0–8.0)

## 2016-10-12 MED ORDER — MORPHINE SULFATE (PF) 2 MG/ML IV SOLN
INTRAVENOUS | Status: AC
  Filled 2016-10-12: qty 2

## 2016-10-12 MED ORDER — AMOXICILLIN-POT CLAVULANATE 875-125 MG PO TABS: 1.0000 | ORAL_TABLET | Freq: Two times a day (BID) | ORAL | 0 refills | Status: DC

## 2016-10-12 MED ORDER — IOPAMIDOL (ISOVUE-300) INJECTION 61%
100.0000 mL | Freq: Once | INTRAVENOUS | Status: AC | PRN
  Administered 2016-10-12: 100 mL via INTRAVENOUS

## 2016-10-12 MED ORDER — MORPHINE SULFATE (PF) 4 MG/ML IV SOLN
4.0000 mg | Freq: Once | INTRAVENOUS | Status: AC
  Administered 2016-10-12: 4 mg via INTRAVENOUS
  Filled 2016-10-12: qty 1

## 2016-10-12 MED ORDER — OXYCODONE-ACETAMINOPHEN 5-325 MG PO TABS: 1.0000 | ORAL_TABLET | ORAL | 0 refills | Status: DC | PRN

## 2016-10-12 MED ORDER — MORPHINE SULFATE (PF) 4 MG/ML IV SOLN
4.0000 mg | Freq: Once | INTRAVENOUS | Status: AC
Start: 2016-10-12 — End: 2016-10-12
  Administered 2016-10-12: 4 mg via INTRAVENOUS

## 2016-10-12 MED ORDER — SODIUM CHLORIDE 0.9 % IV BOLUS (SEPSIS)
500.0000 mL | Freq: Once | INTRAVENOUS | Status: AC
  Administered 2016-10-12: 500 mL via INTRAVENOUS

## 2016-10-12 MED ORDER — POLYETHYLENE GLYCOL 3350 17 G PO PACK: 17.0000 g | PACK | Freq: Every day | ORAL | 0 refills | Status: DC

## 2016-10-12 NOTE — Discharge Instructions (Signed)
Watch for fevers or increasing pain. Follow-up with GI and urology.

## 2016-10-12 NOTE — ED Notes (Signed)
Pt alert & oriented x4, stable gait. Patient given discharge instructions, paperwork & prescription(s). Patient informed not to drive, operate any equipment & handel any important documents 4 hours after taking pain medication. Patient  instructed to stop at the registration desk to finish any additional paperwork. Patient  verbalized understanding. Pt left department w/ no further questions. 

## 2016-10-12 NOTE — ED Triage Notes (Signed)
Pt c/o rectal/perineum pain x 4 days.

## 2016-10-12 NOTE — ED Provider Notes (Signed)
[facility] Provider Note   CSN: [id number] Arrival date & time: 10/12/16  2035     History   Chief Complaint Chief Complaint  Patient presents with  . Rectal Pain    HPI Ronnie Kelly is a 21 y.o. male.  HPI Patient presents with rectal pain. Began a few days ago. Initially began with urination and then with bowel movements. Then became constant. No fevers. Pain worse with sitting also. No fevers. No trauma. Nothing has been up his rectum. Past Medical History:  Diagnosis Date  . Asthma   . Bipolar disorder (HCC)     There are no active problems to display for this patient.   Past Surgical History:  Procedure Laterality Date  . NOSE SURGERY         Home Medications    Prior to Admission medications   Medication Sig Start Date End Date Taking? Authorizing Provider  albuterol (PROVENTIL HFA;VENTOLIN HFA) 108 (90 BASE) MCG/ACT inhaler Inhale 2 puffs into the lungs every 4 (four) hours as needed for wheezing or shortness of breath. 05/26/14   Janne NapoleonNeese, Hope M, NP  amoxicillin-clavulanate (AUGMENTIN) 875-125 MG tablet Take 1 tablet by mouth every 12 (twelve) hours. 10/12/16   Benjiman CorePickering, Avenir Lozinski, MD  oxyCODONE-acetaminophen (PERCOCET/ROXICET) 5-325 MG tablet Take 1-2 tablets by mouth every 4 (four) hours as needed for severe pain. 10/12/16   Benjiman CorePickering, Chaquana Nichols, MD  polyethylene glycol [facility](MIRALAX / Ethelene HalGLYCOLAX) packet Take 17 g by mouth daily. While taking pain medicines. 10/12/16   Benjiman CorePickering, Cearra Portnoy, MD    Family History Family History  Problem Relation Age of Onset  . Asthma Other     Social History Social History  Substance Use Topics  . Smoking status: Current Every Day Smoker    Packs/day: 0.50    Years: 1.50    Types: Cigarettes  . Smokeless tobacco: Current User    Types: Snuff  . Alcohol use Yes     Allergies   Patient has no known allergies.   Review of Systems Review of Systems  Constitutional: Negative for appetite change, chills and fever.    HENT: Negative for congestion.   Gastrointestinal: Positive for rectal pain. Negative for anal bleeding, blood in stool, constipation and vomiting.  Genitourinary: Positive for dysuria. Negative for difficulty urinating, discharge, genital sores and testicular pain.  Musculoskeletal: Negative for back pain.  Neurological: Negative for syncope.  Hematological: Negative for adenopathy.     Physical Exam Updated Vital Signs BP (!) 148/88   Pulse 98   Temp 98.3 F (36.8 C)   Resp 18   Ht 6\' 1"  (1.854 m)   Wt 111.1 kg (245 lb)   SpO2 98%   BMI 32.32 kg/m   Physical Exam  Constitutional: He appears well-developed.  HENT:  Head: Atraumatic.  Cardiovascular: Normal rate.   Abdominal: Soft.  Genitourinary: Penis normal.  Genitourinary Comments: Perineal tenderness. Also right peri rectal tenderness and firmness. No erythema.  Musculoskeletal: He exhibits no edema.  Neurological: He is alert.  Skin: Skin is warm.   no testicular tenderness or mass. No penile lesions. No penile discharge.   ED Treatments / Results  Labs (all labs ordered are listed, but only abnormal results are displayed) Labs Reviewed  BASIC METABOLIC PANEL - Abnormal; Notable for the following:       Result Value   Glucose, Bld 102 (*)    All other components within normal limits  CBC WITH DIFFERENTIAL/PLATELET - Abnormal; Notable for the following:  Platelets 148 (*)    All other components within normal limits  URINALYSIS, ROUTINE W REFLEX MICROSCOPIC - Abnormal; Notable for the following:    Specific Gravity, Urine 1.031 (*)    All other components within normal limits    EKG  EKG Interpretation None       Radiology Ct Pelvis W Contrast  Result Date: 10/12/2016 CLINICAL DATA:  Rectal pain EXAM: CT PELVIS WITH CONTRAST TECHNIQUE: Multidetector CT imaging of the pelvis was performed using the standard protocol following the bolus administration of intravenous contrast. CONTRAST:   ISOVUE-300 IOPAMIDOL (ISOVUE-300) INJECTION 61% COMPARISON:  None. FINDINGS: Urinary Tract:  No abnormality visualized. Bowel: Unremarkable visualized pelvic bowel loops. There is no visible rectal inflammation or perianal abnormality. Vascular/Lymphatic: No pathologically enlarged lymph nodes or vascular abnormality. Reproductive:  Normal prostate and seminal vesicles. Other:  None Musculoskeletal: No bony spinal canal stenosis. No focal osseous abnormality. IMPRESSION: Normal pelvic CT.  No visible rectal or perianal abnormality. Electronically Signed   By: Deatra Robinson M.D.   On: 10/12/2016 22:13    Procedures Procedures (including critical care time)  Medications Ordered in ED Medications  morphine 2 MG/ML injection (not administered)  sodium chloride 0.9 % bolus 500 mL (0 mLs Intravenous Stopped 10/12/16 2240)  morphine 4 MG/ML injection 4 mg (4 mg Intravenous Given 10/12/16 2135)  iopamidol (ISOVUE-300) 61 % injection 100 mL (100 mLs Intravenous Contrast Given 10/12/16 2150)  morphine 4 MG/ML injection 4 mg (4 mg Intravenous Given 10/12/16 2320)     Initial Impression / Assessment and Plan / ED Course  I have reviewed the triage vital signs and the nursing notes.  Pertinent labs & imaging results that were available during my care of the patient were reviewed by me and considered in my medical decision making (see chart for details).     Patient with rectal pain. Also had had some dysuria. Perineal tenderness. CT scan however does not show any prostatitis or rectal abnormality. We'll give antibiotics however due to the rectal swelling. Will have follow-up with GI and urology since it was some urinary symptoms. Denies possibility of STDs. Discharge home.  Final Clinical Impressions(s) / ED Diagnoses   Final diagnoses:  Rectal pain    New Prescriptions Discharge Medication List as of 10/12/2016 11:19 PM    START taking these medications   Details  amoxicillin-clavulanate  (AUGMENTIN) 875-125 MG tablet Take 1 tablet by mouth every 12 (twelve) hours., Starting Sat 10/12/2016, Print    oxyCODONE-acetaminophen (PERCOCET/ROXICET) 5-325 MG tablet Take 1-2 tablets by mouth every 4 (four) hours as needed for severe pain., Starting Sat 10/12/2016, Print    polyethylene glycol (MIRALAX / GLYCOLAX) packet Take 17 g by mouth daily. While taking pain medicines., Starting Sat 10/12/2016, Print         Benjiman Core, MD 10/13/16 0005

## 2016-10-29 ENCOUNTER — Encounter (HOSPITAL_COMMUNITY): Payer: Self-pay | Admitting: *Deleted

## 2016-10-29 ENCOUNTER — Emergency Department (HOSPITAL_COMMUNITY)
Admission: EM | Admit: 2016-10-29 | Discharge: 2016-10-29 | Discharge status: Against Medical Advice | Payer: Medicaid Other | Attending: Emergency Medicine | Admitting: Emergency Medicine

## 2016-10-29 DIAGNOSIS — R1110 Vomiting, unspecified: Secondary | ICD-10-CM | POA: Diagnosis not present

## 2016-10-29 DIAGNOSIS — Z5321 Procedure and treatment not carried out due to patient leaving prior to being seen by health care provider: Secondary | ICD-10-CM | POA: Insufficient documentation

## 2016-10-29 LAB — LIPASE, BLOOD: Lipase: 22 U/L (ref 11–51)

## 2016-10-29 LAB — COMPREHENSIVE METABOLIC PANEL
ALK PHOS: 62 U/L (ref 38–126)
ALT: 22 U/L (ref 17–63)
ANION GAP: 9 (ref 5–15)
AST: 17 U/L (ref 15–41)
Albumin: 4.4 g/dL (ref 3.5–5.0)
BUN: 17 mg/dL (ref 6–20)
CALCIUM: 9.6 mg/dL (ref 8.9–10.3)
CO2: 25 mmol/L (ref 22–32)
Chloride: 107 mmol/L (ref 101–111)
Creatinine, Ser: 0.82 mg/dL (ref 0.61–1.24)
GFR calc Af Amer: >60 mL/min (ref >60)
GFR calc non Af Amer: >60 mL/min (ref >60)
GLUCOSE: 110 mg/dL — AB (ref 65–99)
Potassium: 4.2 mmol/L (ref 3.5–5.1)
Sodium: 141 mmol/L (ref 135–145)
Total Bilirubin: 0.6 mg/dL (ref 0.3–1.2)
Total Protein: 8.3 g/dL — ABNORMAL HIGH (ref 6.5–8.1)

## 2016-10-29 LAB — CBC
HCT: 42.6 % (ref 39.0–52.0)
HEMOGLOBIN: 14.8 g/dL (ref 13.0–17.0)
MCH: 29.7 pg (ref 26.0–34.0)
MCHC: 34.7 g/dL (ref 30.0–36.0)
MCV: 85.5 fL (ref 78.0–100.0)
Platelets: 153 10*3/uL (ref 150–400)
RBC: 4.98 MIL/uL (ref 4.22–5.81)
RDW: 13 % (ref 11.5–15.5)
WBC: 10.8 10*3/uL — ABNORMAL HIGH (ref 4.0–10.5)

## 2016-10-29 NOTE — ED Triage Notes (Signed)
Pt comes in for vomiting, fever, and generalized weakness for 3 days. States he has a rash on his abdomen that started 2 days ago. He pulled a tick off of his leg 5 days ago. NAD noted.

## 2016-10-29 NOTE — ED Notes (Signed)
Left Without Being Seen due to wait time

## 2016-10-29 NOTE — ED Notes (Signed)
Pt adds that his neck has felt stiff for the last 2 days.

## 2017-10-08 IMAGING — CT CT PELVIS W/ CM
2 of 4 series · 17 of 46 positions shown, 19 images · IV contrast (Isovue)
Comparison: None.

CLINICAL DATA: Rectal pain

EXAM:
CT PELVIS WITH CONTRAST
TECHNIQUE: Multidetector CT imaging of the pelvis was performed using the
standard protocol following the bolus administration of intravenous
contrast.
CONTRAST:  100mL ZD2CUA-K66 IOPAMIDOL (ZD2CUA-K66) INJECTION 61%

[Series 2: axial st · axial · 0.85mm/px · z∈[+696,+996]mm · 14 of 70 slices shown, 16 images]
[im 5/70  soft-tissue]
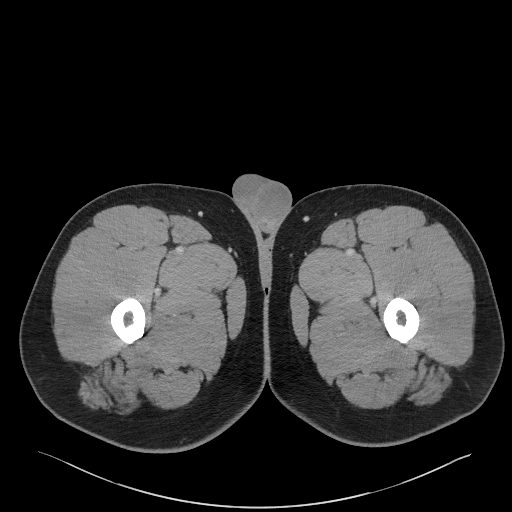
[im 5/70  bone]
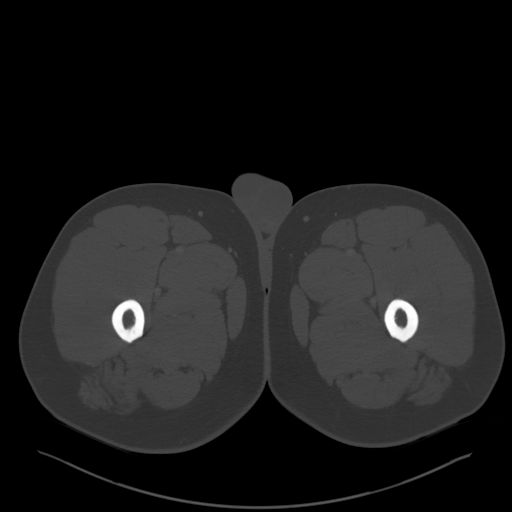
[im 9/70  soft-tissue]
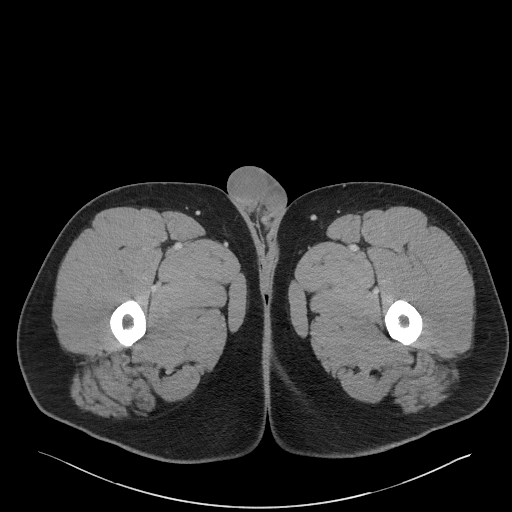
[im 13/70  soft-tissue]
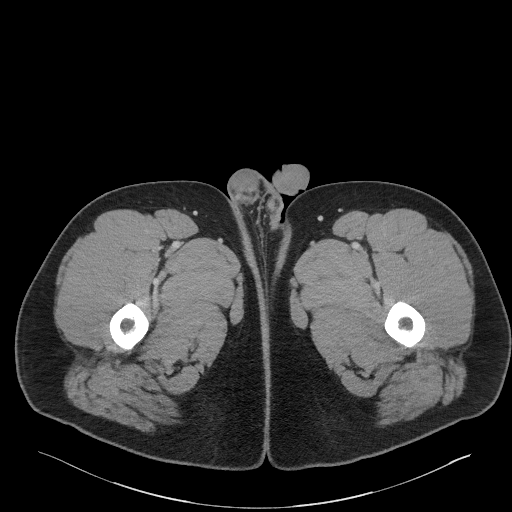
[im 18/70  soft-tissue]
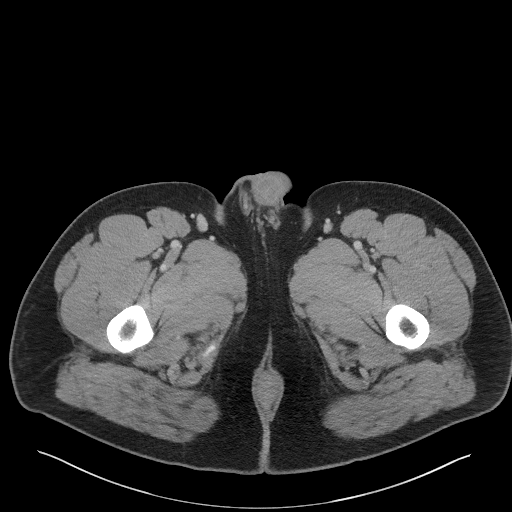
[im 22/70  soft-tissue]
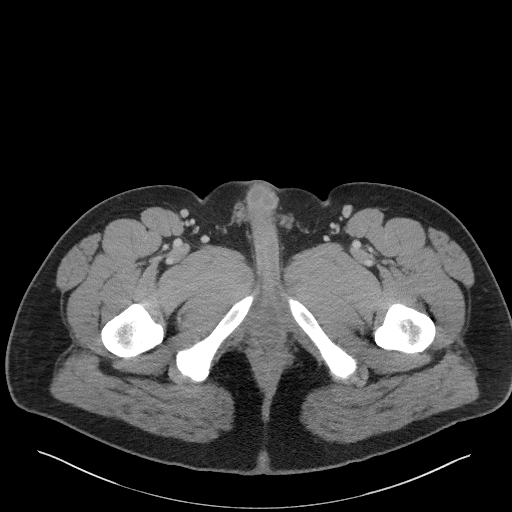
[im 26/70  soft-tissue]
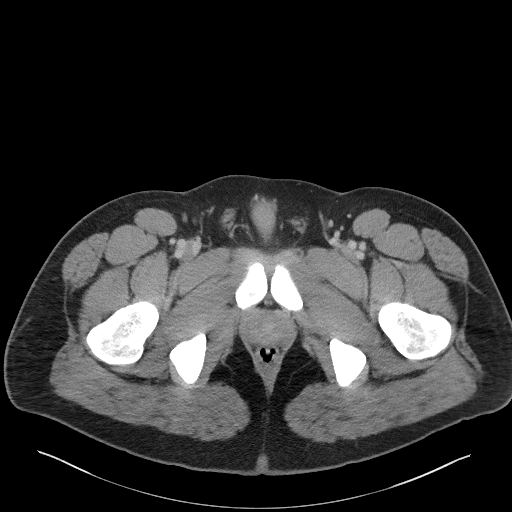
[im 31/70  soft-tissue]
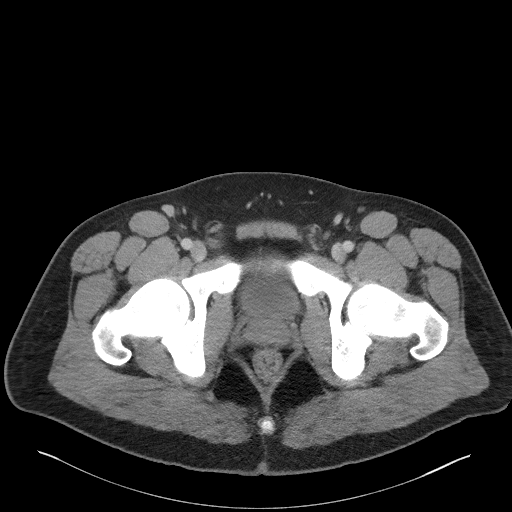
[im 39/70  soft-tissue]
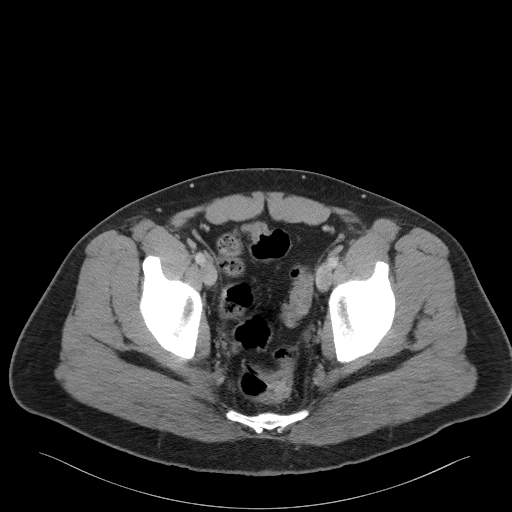
[im 44/70  soft-tissue]
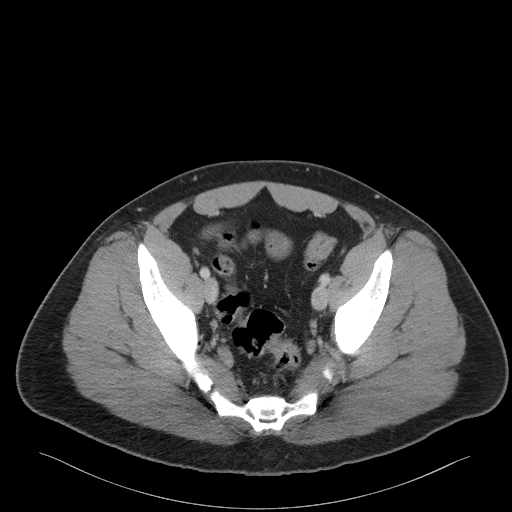
[im 44/70  bone]
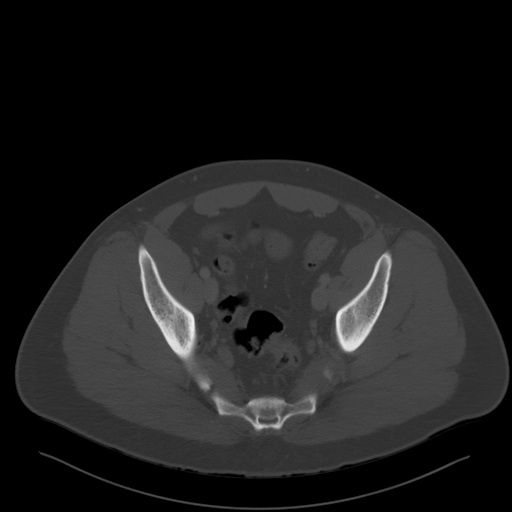
[im 48/70  soft-tissue]
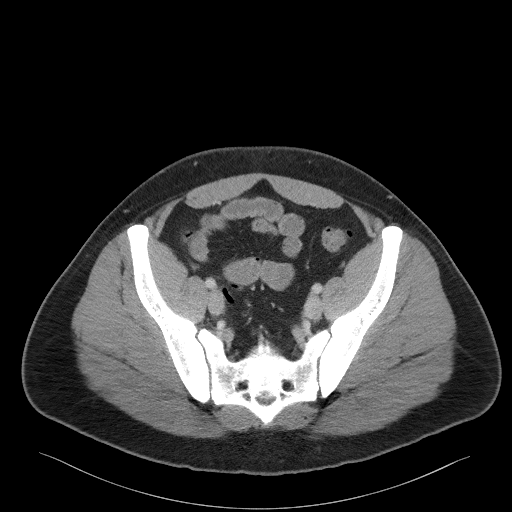
[im 52/70  soft-tissue]
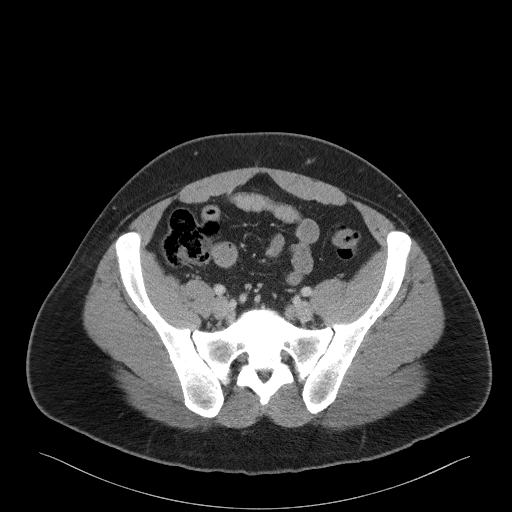
[im 57/70  soft-tissue]
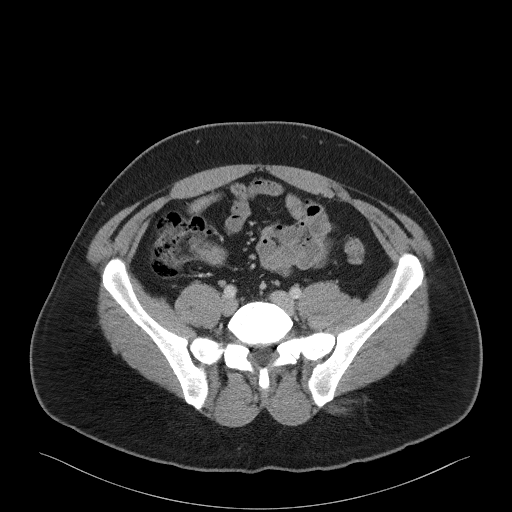
[im 61/70  soft-tissue]
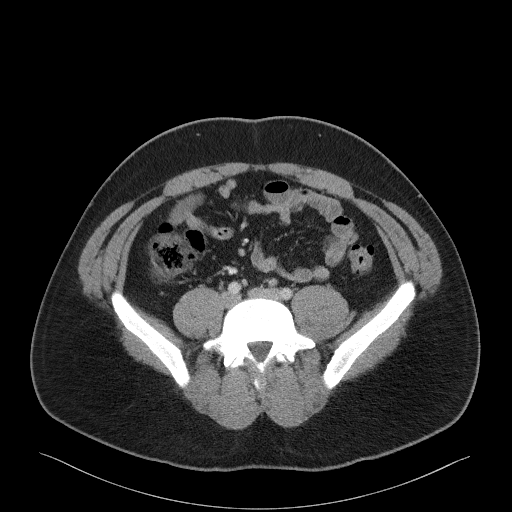
[im 65/70  soft-tissue]
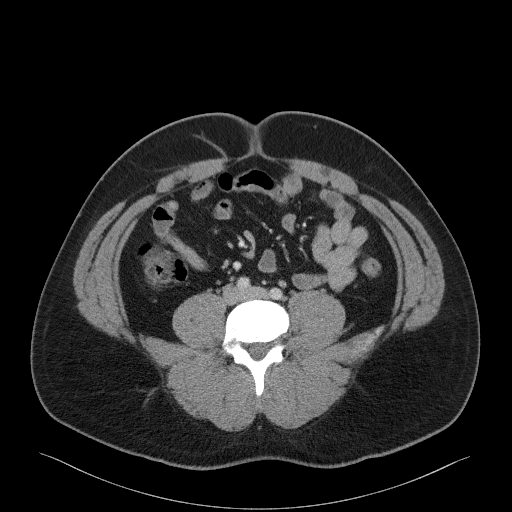

[Series 4: coronal st · coronal · 0.72mm/px · 3 of 104 slices shown]
[im 35/104  soft-tissue]
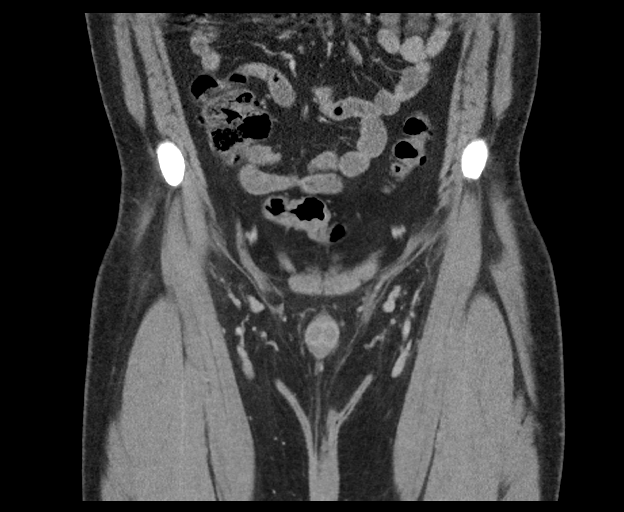
[im 46/104  soft-tissue]
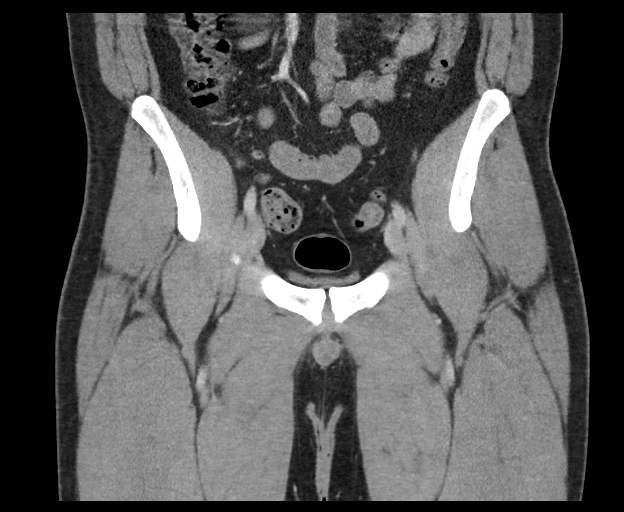
[im 58/104  soft-tissue]
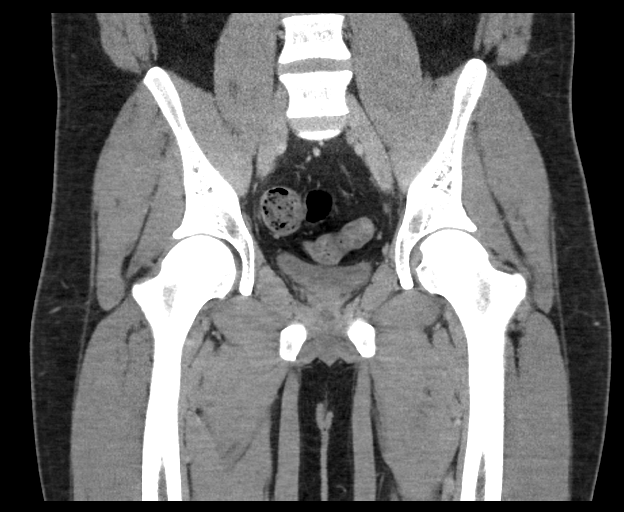

[17 of 46 positions shown; findings below may reference images not displayed]

FINDINGS: Urinary Tract:  No abnormality visualized.

Bowel: Unremarkable visualized pelvic bowel loops. There is no
visible rectal inflammation or perianal abnormality.

Vascular/Lymphatic: No pathologically enlarged lymph nodes or
vascular abnormality.

Reproductive:  Normal prostate and seminal vesicles.

Other:  None

Musculoskeletal: No bony spinal canal stenosis. No focal osseous
abnormality.
IMPRESSION: Normal pelvic CT.  No visible rectal or perianal abnormality.

## 2018-02-23 ENCOUNTER — Emergency Department (HOSPITAL_COMMUNITY): Payer: Medicaid Other

## 2018-02-23 ENCOUNTER — Encounter (HOSPITAL_COMMUNITY): Payer: Self-pay | Admitting: Emergency Medicine

## 2018-02-23 ENCOUNTER — Other Ambulatory Visit: Payer: Self-pay

## 2018-02-23 ENCOUNTER — Emergency Department (HOSPITAL_COMMUNITY)
Admission: EM | Admit: 2018-02-23 | Discharge: 2018-02-24 | Disposition: A | Discharge status: Home / Self Care | Payer: Medicaid Other | Attending: Emergency Medicine | Admitting: Emergency Medicine

## 2018-02-23 DIAGNOSIS — K6289 Other specified diseases of anus and rectum: Secondary | ICD-10-CM | POA: Insufficient documentation

## 2018-02-23 DIAGNOSIS — F17210 Nicotine dependence, cigarettes, uncomplicated: Secondary | ICD-10-CM | POA: Insufficient documentation

## 2018-02-23 DIAGNOSIS — J45909 Unspecified asthma, uncomplicated: Secondary | ICD-10-CM | POA: Diagnosis not present

## 2018-02-23 LAB — CBC WITH DIFFERENTIAL/PLATELET
Abs Immature Granulocytes: 0.04 10*3/uL (ref 0.00–0.07)
BASOS ABS: 0 10*3/uL (ref 0.0–0.1)
BASOS PCT: 0 %
Eosinophils Absolute: 0.1 10*3/uL (ref 0.0–0.5)
Eosinophils Relative: 1 %
HEMATOCRIT: 43 % (ref 39.0–52.0)
Hemoglobin: 14.5 g/dL (ref 13.0–17.0)
IMMATURE GRANULOCYTES: 0 %
Lymphocytes Relative: 21 %
Lymphs Abs: 2.6 10*3/uL (ref 0.7–4.0)
MCH: 29 pg (ref 26.0–34.0)
MCHC: 33.7 g/dL (ref 30.0–36.0)
MCV: 86 fL (ref 80.0–100.0)
Monocytes Absolute: 0.9 10*3/uL (ref 0.1–1.0)
Monocytes Relative: 7 %
NEUTROS PCT: 71 %
NRBC: 0 % (ref 0.0–0.2)
Neutro Abs: 8.6 10*3/uL — ABNORMAL HIGH (ref 1.7–7.7)
PLATELETS: 171 10*3/uL (ref 150–400)
RBC: 5 MIL/uL (ref 4.22–5.81)
RDW: 13.2 % (ref 11.5–15.5)
WBC: 12.3 10*3/uL — AB (ref 4.0–10.5)

## 2018-02-23 MED ORDER — ONDANSETRON HCL 4 MG/2ML IJ SOLN
4.0000 mg | Freq: Once | INTRAMUSCULAR | Status: AC
  Administered 2018-02-23: 4 mg via INTRAVENOUS
  Filled 2018-02-23: qty 2

## 2018-02-23 MED ORDER — HYDROMORPHONE HCL 1 MG/ML IJ SOLN
1.0000 mg | Freq: Once | INTRAMUSCULAR | Status: AC
  Administered 2018-02-23: 1 mg via INTRAVENOUS
  Filled 2018-02-23: qty 1

## 2018-02-23 MED ORDER — IOPAMIDOL (ISOVUE-300) INJECTION 61%
100.0000 mL | Freq: Once | INTRAVENOUS | Status: AC | PRN
Start: 2018-02-23 — End: 2018-02-24
  Administered 2018-02-24: 100 mL via INTRAVENOUS

## 2018-02-23 NOTE — ED Provider Notes (Signed)
[facility] EMERGENCY DEPARTMENT Provider Note   CSN: [id number] Arrival date & time: 02/23/18  2220     History   Chief Complaint Chief Complaint  Patient presents with  . Abscess    HPI Ronnie Kelly is a 22 y.o. male.  HPI   Ronnie Kelly is a 22 y.o. male who presents to the Emergency Department complaining of rectal pain and swelling between his upper buttocks.  He reports history of previous abscess to this area.  Pain became severe 2 days ago.  Pain is associated with sitting and with defecation.  No abdominal pain, fevers or chills.  No history of trauma or vomiting.  He states he has been treated for this in the past and was advised to see a Careers adviser but he has not done so. Symptoms typically resolve with antibiotics.     Past Medical History:  Diagnosis Date  . Asthma   . Bipolar disorder (HCC)     There are no active problems to display for this patient.   Past Surgical History:  Procedure Laterality Date  . NOSE SURGERY        Home Medications    Prior to Admission medications   Medication Sig Start Date End Date Taking? Authorizing Provider  albuterol (PROVENTIL HFA;VENTOLIN HFA) 108 (90 BASE) MCG/ACT inhaler Inhale 2 puffs into the lungs every 4 (four) hours as needed for wheezing or shortness of breath. 05/26/14   Janne Napoleon, NP  amoxicillin-clavulanate (AUGMENTIN) 875-125 MG tablet Take 1 tablet by mouth every 12 (twelve) hours. 10/12/16   Benjiman Core, MD  oxyCODONE-acetaminophen (PERCOCET/ROXICET) 5-325 MG tablet Take 1-2 tablets by mouth every 4 (four) hours as needed for severe pain. 10/12/16   Benjiman Core, MD  polyethylene glycol [facility] / Ethelene Hal) packet Take 17 g by mouth daily. While taking pain medicines. 10/12/16   Benjiman Core, MD    Family History Family History  Problem Relation Age of Onset  . Asthma Other     Social History Social History   Tobacco Use  . Smoking status: Current Every Day Smoker   Packs/day: 0.50    Years: 1.50    Pack years: 0.75    Types: Cigarettes  . Smokeless tobacco: Current User    Types: Snuff  Substance Use Topics  . Alcohol use: Yes    Comment: occ.  . Drug use: No     Allergies   Patient has no known allergies.   Review of Systems Review of Systems  Constitutional: Negative for appetite change, chills and fever.  Respiratory: Negative for shortness of breath.   Cardiovascular: Negative for chest pain.  Gastrointestinal: Positive for rectal pain. Negative for abdominal pain, diarrhea, nausea and vomiting.  Genitourinary: Negative for dysuria, penile swelling, scrotal swelling and testicular pain.  Musculoskeletal: Negative for arthralgias, back pain and myalgias.  Neurological: Negative for dizziness and weakness.     Physical Exam Updated Vital Signs BP (!) 142/87 (BP Location: Right Arm)   Pulse (!) 115   Temp 99.2 F (37.3 C) (Oral)   Resp 17   Ht 6\' 1"  (1.854 m)   Wt 112 kg   SpO2 98%   BMI 32.59 kg/m   Physical Exam  Constitutional: He appears well-nourished.  Patient is tearful and uncomfortable appearing  HENT:  Head: Atraumatic.  Mouth/Throat: Oropharynx is clear and moist.  Cardiovascular: Regular rhythm and intact distal pulses. Tachycardia present.  Pulmonary/Chest: Effort normal and breath sounds normal. No respiratory distress.  Abdominal:  Soft. He exhibits no distension and no mass. There is no tenderness. There is no guarding.  Genitourinary:  Genitourinary Comments: Diffuse perineal tenderness, patient is uncooperative for full exam.  No palpable fluctuance or firmness of the buttocks or rectum.  No erythema.  Musculoskeletal: Normal range of motion.  Neurological: He is alert. No sensory deficit.  Skin: Skin is warm. Capillary refill takes less than 2 seconds. No rash noted.  Nursing note and vitals reviewed.    ED Treatments / Results  Labs (all labs ordered are listed, but only abnormal results are  displayed) Labs Reviewed  CBC WITH DIFFERENTIAL/PLATELET - Abnormal; Notable for the following components:      Result Value   WBC 12.3 (*)    Neutro Abs 8.6 (*)    All other components within normal limits  COMPREHENSIVE METABOLIC PANEL  URINALYSIS, ROUTINE W REFLEX MICROSCOPIC    EKG None  Radiology Ct Pelvis W Contrast  Result Date: 02/24/2018 CLINICAL DATA:  22 year old male with concern for perirectal abscess. EXAM: CT PELVIS WITH CONTRAST TECHNIQUE: Multidetector CT imaging of the pelvis was performed using the standard protocol following the bolus administration of intravenous contrast. CONTRAST:  100mL ISOVUE-300 IOPAMIDOL (ISOVUE-300) INJECTION 61% COMPARISON:  CT of the pelvis dated 10/12/2016 FINDINGS: Urinary Tract:  No abnormality visualized. Bowel:  Unremarkable visualized pelvic bowel loops. Vascular/Lymphatic: No pathologically enlarged lymph nodes. No significant vascular abnormality seen. Reproductive: The prostate and seminal vesicles are grossly unremarkable. Other:  None. No fluid collection or abscess. Musculoskeletal: No suspicious bone lesions identified. IMPRESSION: No perirectal or perianal fluid collection/abscess. Electronically Signed   By: Elgie CollardArash  Radparvar M.D.   On: 02/24/2018 01:33    Procedures Procedures (including critical care time)  Medications Ordered in ED Medications  HYDROmorphone (DILAUDID) injection 1 mg (has no administration in time range)  ondansetron (ZOFRAN) injection 4 mg (has no administration in time range)  iopamidol (ISOVUE-300) 61 % injection 100 mL (has no administration in time range)     Initial Impression / Assessment and Plan / ED Course  I have reviewed the triage vital signs and the nursing notes.  Pertinent labs & imaging results that were available during my care of the patient were reviewed by me and considered in my medical decision making (see chart for details).     Patient is uncomfortable appearing but  nontoxic.  Symptoms are similar to previous.  He has been evaluated here for previous rectal pain.  I do not see an obvious rectal or pilonidal abscess, but he is uncooperative for exam, so I will obtain CT.  Pt feeling better after pain medication.  CT neg for abscess.  Patient reports resolution of symptoms previously after taking antibiotics.  I will prescribe antibiotics again and patient understands that he will need further outpatient follow-up.  Final Clinical Impressions(s) / ED Diagnoses   Final diagnoses:  Rectal pain    ED Discharge Orders    None       Pauline Ausriplett, Kenden Brandt, PA-C 02/24/18 0151    Shon BatonHorton, Courtney F, MD 02/24/18 0401

## 2018-02-23 NOTE — ED Triage Notes (Signed)
Pt C/O abscess "in the middle of my butt cheeks on the top." Pt denies fevers at home.

## 2018-02-24 LAB — COMPREHENSIVE METABOLIC PANEL
ALK PHOS: 69 U/L (ref 38–126)
ALT: 23 U/L (ref 0–44)
AST: 21 U/L (ref 15–41)
Albumin: 4.3 g/dL (ref 3.5–5.0)
Anion gap: 8 (ref 5–15)
BUN: 20 mg/dL (ref 6–20)
CALCIUM: 9.3 mg/dL (ref 8.9–10.3)
CO2: 23 mmol/L (ref 22–32)
CREATININE: 1.07 mg/dL (ref 0.61–1.24)
Chloride: 107 mmol/L (ref 98–111)
GFR calc non Af Amer: >60 mL/min (ref >60)
GLUCOSE: 100 mg/dL — AB (ref 70–99)
Potassium: 3.5 mmol/L (ref 3.5–5.1)
SODIUM: 138 mmol/L (ref 135–145)
Total Bilirubin: 0.6 mg/dL (ref 0.3–1.2)
Total Protein: 7.5 g/dL (ref 6.5–8.1)

## 2018-02-24 LAB — URINALYSIS, ROUTINE W REFLEX MICROSCOPIC
Bilirubin Urine: NEGATIVE
Glucose, UA: NEGATIVE mg/dL
Hgb urine dipstick: NEGATIVE
Ketones, ur: NEGATIVE mg/dL
Leukocytes, UA: NEGATIVE
Nitrite: NEGATIVE
Protein, ur: NEGATIVE mg/dL
Specific Gravity, Urine: 1.032 — ABNORMAL HIGH (ref 1.005–1.030)
pH: 5 (ref 5.0–8.0)

## 2018-02-24 MED ORDER — POLYETHYLENE GLYCOL 3350 17 G PO PACK: 17.0000 g | PACK | Freq: Every day | ORAL | 0 refills | Status: AC

## 2018-02-24 MED ORDER — AMOXICILLIN-POT CLAVULANATE 875-125 MG PO TABS: 1.0000 | ORAL_TABLET | Freq: Two times a day (BID) | ORAL | 0 refills | Status: AC

## 2018-02-24 MED ORDER — OXYCODONE-ACETAMINOPHEN 5-325 MG PO TABS: 1.0000 | ORAL_TABLET | ORAL | 0 refills | Status: AC | PRN

## 2018-02-24 NOTE — Discharge Instructions (Addendum)
Warm water soaks several times a day.  Take the antibiotics as directed.  Contact 1 of the primary care providers listed to arrange a follow-up appointment.  [facility]    Kari BaarsEdward Hawkins MD. Specialty: Pulmonary Disease Contact information: [address]  [address]  [place] [address]  [phone]   Syliva OvermanMargaret Simpson, MD. Specialty: [facility] Contact information: [address]  [place] [address]  [phone]   Lilyan PuntScott Luking, MD. Specialty: [facility] Contact information: [address]  [address]  [place] [address]  [phone]   Avon Gullyesfaye Fanta, MD Specialty: Internal Medicine Contact information: [address]  [place] [address]  [phone]   Catalina PizzaZach Hall, MD. Specialty: Internal Medicine Contact information: [address]  [place] [address]  [phone]    [facility] (Dr. Selena BattenKim) Specialty: Family Medicine Contact information: [address]  [place] [address]  [phone]   John GiovanniStephen Knowlton, MD. Specialty: [facility] Contact information: [address]  [address]  [place] [address]  [phone]   Carylon Perchesoy Fagan, MD. Specialty: Internal Medicine Contact information: [address]  [address]  [place] [address]  [phone]    [facility]  [address] [place] [address] [phone]  Services [facility] offers a variety of basic health services.  Services include but are not limited to: Blood pressure checks  Heart rate checks  Blood sugar checks  Urine analysis  Rapid strep tests  Pregnancy tests.  Health education and referrals  People needing more complex services will be directed to a physician online. Using these virtual visits, doctors can evaluate and prescribe medicine and treatments. There will be no medication on-site, though [facility] will  help patients fill their prescriptions at little to no cost.   For More information please go to: [email or web address]
